# Patient Record
Sex: Female | Born: 1960 | Race: Black or African American | Hispanic: No | Marital: Married | Smoking: Former smoker
Health system: Southern US, Community
[De-identification: ages and names within clinical notes are randomized; demographics above are authoritative.]

## PROBLEM LIST (undated history)

## (undated) DIAGNOSIS — M25569 Pain in unspecified knee: Secondary | ICD-10-CM

## (undated) DIAGNOSIS — I10 Essential (primary) hypertension: Secondary | ICD-10-CM

## (undated) DIAGNOSIS — Z973 Presence of spectacles and contact lenses: Secondary | ICD-10-CM

## (undated) DIAGNOSIS — R42 Dizziness and giddiness: Secondary | ICD-10-CM

## (undated) DIAGNOSIS — M545 Low back pain, unspecified: Secondary | ICD-10-CM

## (undated) DIAGNOSIS — K219 Gastro-esophageal reflux disease without esophagitis: Secondary | ICD-10-CM

## (undated) DIAGNOSIS — K829 Disease of gallbladder, unspecified: Secondary | ICD-10-CM

## (undated) DIAGNOSIS — M25559 Pain in unspecified hip: Secondary | ICD-10-CM

## (undated) DIAGNOSIS — R7303 Prediabetes: Secondary | ICD-10-CM

## (undated) DIAGNOSIS — R0602 Shortness of breath: Secondary | ICD-10-CM

## (undated) DIAGNOSIS — E669 Obesity, unspecified: Secondary | ICD-10-CM

## (undated) DIAGNOSIS — M1990 Unspecified osteoarthritis, unspecified site: Secondary | ICD-10-CM

## (undated) DIAGNOSIS — T7840XA Allergy, unspecified, initial encounter: Secondary | ICD-10-CM

## (undated) DIAGNOSIS — E785 Hyperlipidemia, unspecified: Secondary | ICD-10-CM

## (undated) DIAGNOSIS — M7989 Other specified soft tissue disorders: Secondary | ICD-10-CM

## (undated) HISTORY — DX: Prediabetes: R73.03

## (undated) HISTORY — PX: CHOLECYSTECTOMY: SHX55

## (undated) HISTORY — DX: Essential (primary) hypertension: I10

## (undated) HISTORY — DX: Pain in unspecified hip: M25.559

## (undated) HISTORY — DX: Other specified soft tissue disorders: M79.89

## (undated) HISTORY — PX: TUBAL LIGATION: SHX77

## (undated) HISTORY — DX: Obesity, unspecified: E66.9

## (undated) HISTORY — DX: Disease of gallbladder, unspecified: K82.9

## (undated) HISTORY — DX: Allergy, unspecified, initial encounter: T78.40XA

## (undated) HISTORY — DX: Pain in unspecified knee: M25.569

## (undated) HISTORY — DX: Unspecified osteoarthritis, unspecified site: M19.90

## (undated) HISTORY — DX: Shortness of breath: R06.02

## (undated) HISTORY — DX: Low back pain, unspecified: M54.50

---

## 2014-02-16 ENCOUNTER — Ambulatory Visit: Payer: Self-pay | Admitting: Family Medicine

## 2014-02-16 LAB — HM PAP SMEAR: HM PAP: NORMAL

## 2014-02-16 LAB — HM MAMMOGRAPHY: HM MAMMO: ABNORMAL

## 2014-03-01 ENCOUNTER — Ambulatory Visit: Payer: Self-pay | Admitting: Family Medicine

## 2015-04-28 ENCOUNTER — Ambulatory Visit (INDEPENDENT_AMBULATORY_CARE_PROVIDER_SITE_OTHER): Payer: Commercial Managed Care - HMO | Admitting: Family Medicine

## 2015-04-28 ENCOUNTER — Encounter: Payer: Self-pay | Admitting: Family Medicine

## 2015-04-28 ENCOUNTER — Telehealth: Payer: Self-pay | Admitting: Family Medicine

## 2015-04-28 VITALS — BP 142/100 | HR 80 | Temp 98.5°F | Resp 16 | Ht 65.0 in | Wt 280.0 lb

## 2015-04-28 DIAGNOSIS — Z113 Encounter for screening for infections with a predominantly sexual mode of transmission: Secondary | ICD-10-CM | POA: Diagnosis not present

## 2015-04-28 DIAGNOSIS — Z0000 Encounter for general adult medical examination without abnormal findings: Secondary | ICD-10-CM | POA: Diagnosis not present

## 2015-04-28 DIAGNOSIS — B372 Candidiasis of skin and nail: Secondary | ICD-10-CM | POA: Diagnosis not present

## 2015-04-28 DIAGNOSIS — I10 Essential (primary) hypertension: Secondary | ICD-10-CM

## 2015-04-28 DIAGNOSIS — Z136 Encounter for screening for cardiovascular disorders: Secondary | ICD-10-CM | POA: Diagnosis not present

## 2015-04-28 DIAGNOSIS — Z803 Family history of malignant neoplasm of breast: Secondary | ICD-10-CM | POA: Insufficient documentation

## 2015-04-28 DIAGNOSIS — Z1231 Encounter for screening mammogram for malignant neoplasm of breast: Secondary | ICD-10-CM | POA: Diagnosis not present

## 2015-04-28 DIAGNOSIS — Z13220 Encounter for screening for lipoid disorders: Secondary | ICD-10-CM | POA: Diagnosis not present

## 2015-04-28 DIAGNOSIS — M25561 Pain in right knee: Secondary | ICD-10-CM

## 2015-04-28 DIAGNOSIS — M722 Plantar fascial fibromatosis: Secondary | ICD-10-CM | POA: Insufficient documentation

## 2015-04-28 DIAGNOSIS — Z23 Encounter for immunization: Secondary | ICD-10-CM | POA: Diagnosis not present

## 2015-04-28 DIAGNOSIS — Z1211 Encounter for screening for malignant neoplasm of colon: Secondary | ICD-10-CM | POA: Diagnosis not present

## 2015-04-28 DIAGNOSIS — R030 Elevated blood-pressure reading, without diagnosis of hypertension: Secondary | ICD-10-CM | POA: Insufficient documentation

## 2015-04-28 DIAGNOSIS — IMO0001 Reserved for inherently not codable concepts without codable children: Secondary | ICD-10-CM

## 2015-04-28 DIAGNOSIS — Z800 Family history of malignant neoplasm of digestive organs: Secondary | ICD-10-CM | POA: Insufficient documentation

## 2015-04-28 MED ORDER — NYSTATIN 100000 UNIT/GM EX CREA: 1.0000 "application " | TOPICAL_CREAM | Freq: Two times a day (BID) | CUTANEOUS | Status: DC

## 2015-04-28 MED ORDER — MELOXICAM 15 MG PO TABS: 15.0000 mg | ORAL_TABLET | Freq: Every day | ORAL | Status: DC | PRN

## 2015-04-28 MED ORDER — HYDROCHLOROTHIAZIDE 25 MG PO TABS: 25.0000 mg | ORAL_TABLET | Freq: Every day | ORAL | Status: DC

## 2015-04-28 NOTE — Telephone Encounter (Signed)
HCTZ sent to pharmacy 

## 2015-04-28 NOTE — Progress Notes (Signed)
Name: Knowledge Leaders   MRN: [id number]    DOB: 07/12/1960   Date:04/28/2015       Progress Note  Subjective  Chief Complaint  Chief Complaint  Patient presents with  . Annual Exam    HPI  Patient is here today for a Complete Female Physical Exam:  The patient has complaints of obesity, skin itching under breasts and neck, right knee pain. Right knee pain onset years ago and is on and off. Associated with swelling and laxity. No falls. Did not get xray done that was ordered previously which she had similar complaints. Skin itching under breasts onset a few months ago. No rash but can get red when she scratches the area. No new garments or laundry soaps. Previously history of elevated blood pressure however patient feels that this is only when she comes to the doctor.   Overall feels unhealthy. Max weight 290 lbs.  Diet is not well balanced. In general does not exercise regularly. Adipex helped a little in the past but not much. Sees dentist regularly and addresses vision concerns with ophthalmologist if applicable. In regards to sexual activity the patient is currently sexually active. Currently is not concerned about exposure to any STDs.     Past Medical History  Diagnosis Date  . Obesity   . Hypertension     Past Surgical History  Procedure Laterality Date  . Cholecystectomy      Family History  Problem Relation Age of Onset  . Kidney disease Mother   . Hypertension Mother   . Hypertension Father   . Cancer Sister     breast  . Cancer Brother     colon  . Kidney disease Brother     Social History   Social History  . Marital Status: Married    Spouse Name: N/A  . Number of Children: N/A  . Years of Education: N/A   Occupational History  . Not on file.   Social History Main Topics  . Smoking status: Not on file  . Smokeless tobacco: Not on file  . Alcohol Use: Not on file  . Drug Use: Not on file  . Sexual Activity: Not on file   Other Topics  Concern  . Not on file   Social History Narrative  . No narrative on file    No current outpatient prescriptions on file.  Allergies not on file  ROS  CONSTITUTIONAL: No significant weight changes, fever, chills, weakness or fatigue.  HEENT:  - Eyes: No visual changes.  - Ears: No auditory changes. No pain.  - Nose: No sneezing, congestion, runny nose. - Throat: No sore throat. No changes in swallowing. SKIN: Yes rash or itching.  CARDIOVASCULAR: No chest pain, chest pressure or chest discomfort. No palpitations or edema.  RESPIRATORY: No shortness of breath, cough or sputum.  GASTROINTESTINAL: No anorexia, nausea, vomiting. No changes in bowel habits. No abdominal pain or blood.  GENITOURINARY: No dysuria. No frequency. No discharge.  NEUROLOGICAL: No headache, dizziness, syncope, paralysis, ataxia, numbness or tingling in the extremities. No memory changes. No change in bowel or bladder control.  MUSCULOSKELETAL: Yes joint pain. No muscle pain. HEMATOLOGIC: No anemia, bleeding or bruising.  LYMPHATICS: No enlarged lymph nodes.  PSYCHIATRIC: No change in mood. No change in sleep pattern.  ENDOCRINOLOGIC: No reports of sweating, cold or heat intolerance. No polyuria or polydipsia.   Objective  Filed Vitals:   04/28/15 1413  BP: 142/102  Pulse: 80  Temp: 98.5 F (36.9  C)  TempSrc: Oral  Resp: 16  Height: 5\' 5"  (1.651 m)  Weight: 280 lb (127.007 kg)  SpO2: 96%   Body mass index is 46.59 kg/(m^2).  Depression screen PHQ 2/9 04/28/2015  Decreased Interest 0  Down, Depressed, Hopeless 0  PHQ - 2 Score 0     Physical Exam  Constitutional: Patient is obese and well-nourished. In no distress.  HEENT:  - Head: Normocephalic and atraumatic.  - Ears: Bilateral TMs gray, no erythema or effusion - Nose: Nasal mucosa moist - Mouth/Throat: Oropharynx is clear and moist. No tonsillar hypertrophy or erythema. No post nasal drainage.  - Eyes: Conjunctivae clear, EOM  movements normal. PERRLA. No scleral icterus.  Neck: Normal range of motion. Neck supple. No JVD present. No thyromegaly present.  Cardiovascular: Normal rate, regular rhythm and normal heart sounds.  No murmur heard.  Pulmonary/Chest: Effort normal and breath sounds normal. No respiratory distress. Abdominal: Soft. Bowel sounds are normal, no distension. There is no tenderness. no masses BREAST: Bilateral breast exam normal with no masses, skin changes or nipple discharge FEMALE GENITALIA: Patient declined.  Musculoskeletal: Normal range of motion bilateral UE and LE, no joint effusions. Right knee mild effusion without warmth or crepitus. Normal ROM. No laxity.  Peripheral vascular: Bilateral LE no edema. Neurological: CN II-XII grossly intact with no focal deficits. Alert and oriented to person, place, and time. Coordination, balance, strength, speech and gait are normal.  Skin: Skin is warm and dry. Discoloration under breast w/o skin break down.  Psychiatric: Patient has a normal mood and affect. Behavior is normal in office today. Judgment and thought content normal in office today.   Assessment & Plan  1. Annual physical exam Discussed in detail all recommended preventative measures appropriate for age and gender now and in the future.  2. Encounter for cholesteral screening for cardiovascular disease  - Lipid panel  3. Encounter for screening mammogram for malignant neoplasm of breast  - MM Digital Screening; Future  4. Encounter for screening for malignant neoplasm of colon  - Ambulatory referral to General Surgery  5. Screening for STD (sexually transmitted disease)  - HIV antibody - Hepatitis C antibody  6. Skin yeast infection Wear breathable cotton clothing and under garments.   - nystatin cream (MYCOSTATIN); Apply 1 application topically 2 (two) times daily.  Dispense: 30 g; Refill: 1  7. Right knee pain Chronic long standing problem likely due to arthritis and  obese weight.  - DG Knee Complete 4 Views Right; Future - meloxicam (MOBIC) 15 MG tablet; Take 1 tablet (15 mg total) by mouth daily as needed for pain.  Dispense: 30 tablet; Refill: 2  8. Obesity, Class III, BMI 40-49.9 (morbid obesity) ([place]) The patient has been counseled on their higher than normal BMI.  They have verbally expressed understanding their increased risk for other diseases.  In efforts to meet a better target BMI goal the patient has been counseled on lifestyle, diet and exercise modification tactics. Start with moderate intensity aerobic exercise (walking, jogging, elliptical, swimming, group or individual sports, hiking) at least 51mins a day at least 4 days a week and increase intensity, duration, frequency as tolerated. Diet should include well balance fresh fruits and vegetables avoiding processed foods, carbohydrates and sugars. Drink at least 8oz 10 glasses a day avoiding sodas, sugary fruit drinks, sweetened tea. Check weight on a reliable scale daily and monitor weight loss progress daily. Consider investing in mobile phone apps that will help keep track of weight  loss goals.  - TSH  9. Need for diphtheria-tetanus-pertussis (Tdap) vaccine, adult/adolescent  - Tdap vaccine greater than or equal to 7yo IM  10. Hypertension goal BP (blood pressure) < 140/90 NEW DIAGNOSIS. JNC-8 vs ADA treatment goals discussed with patient. Current blood pressure not well controled. Will start her on HCTZ, she seemed reluctant to accept diagnosis and get routine blood work.   - CBC with Differential/Platelet - Comprehensive metabolic panel - Hemoglobin A1c - Lipid panel - TSH - hydrochlorothiazide (HYDRODIURIL) 25 MG tablet; Take 1 tablet (25 mg total) by mouth daily.  Dispense: 90 tablet; Refill: 1

## 2015-04-28 NOTE — Telephone Encounter (Signed)
Was seen earlier today and in the appointment it was mentioned that you would prescribe fluid pills. Patient does not see the prescription on her clinical summary nor at the pharmacy. Please send to [facility]

## 2015-05-05 LAB — CBC WITH DIFFERENTIAL/PLATELET
BASOS ABS: 0 10*3/uL (ref 0.0–0.2)
Basos: 1 %
EOS (ABSOLUTE): 0.3 10*3/uL (ref 0.0–0.4)
Eos: 8 %
Hematocrit: 37.8 % (ref 34.0–46.6)
Hemoglobin: 14 g/dL (ref 11.1–15.9)
IMMATURE GRANS (ABS): 0 10*3/uL (ref 0.0–0.1)
IMMATURE GRANULOCYTES: 0 %
LYMPHS: 53 %
Lymphocytes Absolute: 1.9 10*3/uL (ref 0.7–3.1)
MCH: 32 pg (ref 26.6–33.0)
MCHC: 37 g/dL — ABNORMAL HIGH (ref 31.5–35.7)
MCV: 87 fL (ref 79–97)
MONOCYTES: 9 %
Monocytes Absolute: 0.3 10*3/uL (ref 0.1–0.9)
NEUTROS PCT: 29 %
Neutrophils Absolute: 1.1 10*3/uL — ABNORMAL LOW (ref 1.4–7.0)
PLATELETS: 211 10*3/uL (ref 150–379)
RBC: 4.37 x10E6/uL (ref 3.77–5.28)
RDW: 12.2 % — ABNORMAL LOW (ref 12.3–15.4)
WBC: 3.7 10*3/uL (ref 3.4–10.8)

## 2015-05-05 LAB — COMPREHENSIVE METABOLIC PANEL
ALT: 20 IU/L (ref 0–32)
AST: 23 IU/L (ref 0–40)
Albumin/Globulin Ratio: 1.5 (ref 1.1–2.5)
Albumin: 4.1 g/dL (ref 3.5–5.5)
Alkaline Phosphatase: 94 IU/L (ref 39–117)
BUN/Creatinine Ratio: 16 (ref 9–23)
BUN: 11 mg/dL (ref 6–24)
Bilirubin Total: 0.4 mg/dL (ref 0.0–1.2)
CALCIUM: 9.8 mg/dL (ref 8.7–10.2)
CHLORIDE: 100 mmol/L (ref 96–106)
CO2: 23 mmol/L (ref 18–29)
Creatinine, Ser: 0.7 mg/dL (ref 0.57–1.00)
GFR, EST AFRICAN AMERICAN: 114 mL/min/{1.73_m2} (ref >59)
GFR, EST NON AFRICAN AMERICAN: 99 mL/min/{1.73_m2} (ref >59)
GLUCOSE: 102 mg/dL — AB (ref 65–99)
Globulin, Total: 2.8 g/dL (ref 1.5–4.5)
Potassium: 4.1 mmol/L (ref 3.5–5.2)
Sodium: 140 mmol/L (ref 134–144)
TOTAL PROTEIN: 6.9 g/dL (ref 6.0–8.5)

## 2015-05-05 LAB — LIPID PANEL
CHOLESTEROL TOTAL: 161 mg/dL (ref 100–199)
Chol/HDL Ratio: 3 ratio units (ref 0.0–4.4)
HDL: 53 mg/dL (ref >39)
LDL CALC: 95 mg/dL (ref 0–99)
TRIGLYCERIDES: 65 mg/dL (ref 0–149)
VLDL CHOLESTEROL CAL: 13 mg/dL (ref 5–40)

## 2015-05-05 LAB — HIV ANTIBODY (ROUTINE TESTING W REFLEX): HIV Screen 4th Generation wRfx: NONREACTIVE

## 2015-05-05 LAB — HEMOGLOBIN A1C
ESTIMATED AVERAGE GLUCOSE: 117 mg/dL
Hgb A1c MFr Bld: 5.7 % — ABNORMAL HIGH (ref 4.8–5.6)

## 2015-05-05 LAB — HEPATITIS C ANTIBODY: Hep C Virus Ab: <0.1 s/co ratio (ref 0.0–0.9)

## 2015-05-05 LAB — TSH: TSH: 1.4 u[IU]/mL (ref 0.450–4.500)

## 2015-05-08 ENCOUNTER — Other Ambulatory Visit: Payer: Self-pay | Admitting: Family Medicine

## 2015-05-08 DIAGNOSIS — D709 Neutropenia, unspecified: Secondary | ICD-10-CM

## 2015-05-10 ENCOUNTER — Telehealth: Payer: Self-pay | Admitting: Family Medicine

## 2015-05-10 NOTE — Telephone Encounter (Signed)
Pt would like a call back. Cell is [phone]

## 2015-05-10 NOTE — Telephone Encounter (Signed)
Discussed labs with patient

## 2015-05-11 ENCOUNTER — Telehealth: Payer: Self-pay

## 2015-05-11 NOTE — Telephone Encounter (Signed)
Gastroenterology Pre-Procedure Review  Request Date: TBD Requesting Physician: Dr. Nadine Counts  PATIENT REVIEW QUESTIONS: The patient responded to the following health history questions as indicated:    1. Are you having any GI issues? no 2. Do you have a personal history of Polyps? no 3. Do you have a family history of Colon Cancer or Polyps? yes (Brother, colon cancer) 4. Diabetes Mellitus? no 5. Joint replacements in the past 12 months?no 6. Major health problems in the past 3 months?no 7. Any artificial heart valves, MVP, or defibrillator?no    MEDICATIONS & ALLERGIES:    Patient reports the following regarding taking any anticoagulation/antiplatelet therapy:   Plavix, Coumadin, Eliquis, Xarelto, Lovenox, Pradaxa, Brilinta, or Effient? no Aspirin? no  Patient confirms/reports the following medications:  Current Outpatient Prescriptions  Medication Sig Dispense Refill  . hydrochlorothiazide (HYDRODIURIL) 25 MG tablet Take 1 tablet (25 mg total) by mouth daily. 90 tablet 1  . meloxicam (MOBIC) 15 MG tablet Take 1 tablet (15 mg total) by mouth daily as needed for pain. 30 tablet 2  . nystatin cream (MYCOSTATIN) Apply 1 application topically 2 (two) times daily. 30 g 1   No current facility-administered medications for this visit.    Patient confirms/reports the following allergies:  No Known Allergies  No orders of the defined types were placed in this encounter.    AUTHORIZATION INFORMATION Primary Insurance: 1D#: Group #:  Secondary Insurance: 1D#: Group #:  SCHEDULE INFORMATION: Date: TBD - Will call back. Needs to speak with husband. Time: Location:

## 2015-05-18 ENCOUNTER — Other Ambulatory Visit: Payer: Self-pay

## 2015-05-25 ENCOUNTER — Other Ambulatory Visit: Payer: Self-pay | Admitting: Family Medicine

## 2015-05-26 LAB — CBC WITH DIFFERENTIAL/PLATELET
BASOS ABS: 0 10*3/uL (ref 0.0–0.2)
Basos: 1 %
EOS (ABSOLUTE): 0.1 10*3/uL (ref 0.0–0.4)
EOS: 3 %
HEMATOCRIT: 43 % (ref 34.0–46.6)
Hemoglobin: 14.5 g/dL (ref 11.1–15.9)
Immature Grans (Abs): 0 10*3/uL (ref 0.0–0.1)
Immature Granulocytes: 0 %
LYMPHS ABS: 2.1 10*3/uL (ref 0.7–3.1)
Lymphs: 47 %
MCH: 30 pg (ref 26.6–33.0)
MCHC: 33.7 g/dL (ref 31.5–35.7)
MCV: 89 fL (ref 79–97)
MONOS ABS: 0.4 10*3/uL (ref 0.1–0.9)
Monocytes: 9 %
NEUTROS PCT: 40 %
Neutrophils Absolute: 1.8 10*3/uL (ref 1.4–7.0)
PLATELETS: 267 10*3/uL (ref 150–379)
RBC: 4.83 x10E6/uL (ref 3.77–5.28)
RDW: 12.8 % (ref 12.3–15.4)
WBC: 4.4 10*3/uL (ref 3.4–10.8)

## 2015-06-09 ENCOUNTER — Encounter: Payer: Self-pay | Admitting: *Deleted

## 2015-06-14 ENCOUNTER — Ambulatory Visit
Admission: RE | Admit: 2015-06-14 | Discharge: 2015-06-14 | Disposition: A | Discharge status: Home / Self Care | Payer: Commercial Managed Care - HMO | Source: Ambulatory Visit | Attending: Family Medicine | Admitting: Family Medicine

## 2015-06-14 DIAGNOSIS — Z1231 Encounter for screening mammogram for malignant neoplasm of breast: Secondary | ICD-10-CM | POA: Diagnosis not present

## 2015-06-15 NOTE — Discharge Instructions (Signed)

## 2015-06-16 ENCOUNTER — Ambulatory Visit: Payer: Commercial Managed Care - HMO | Admitting: Anesthesiology

## 2015-06-16 ENCOUNTER — Ambulatory Visit
Admission: RE | Admit: 2015-06-16 | Discharge: 2015-06-16 | Disposition: A | Discharge status: Home / Self Care | Payer: Commercial Managed Care - HMO | Source: Ambulatory Visit | Attending: Gastroenterology | Admitting: Gastroenterology

## 2015-06-16 ENCOUNTER — Encounter
Admission: RE | Disposition: A | Discharge status: Home / Self Care | Payer: Self-pay | Source: Ambulatory Visit | Attending: Gastroenterology

## 2015-06-16 DIAGNOSIS — K5730 Diverticulosis of large intestine without perforation or abscess without bleeding: Secondary | ICD-10-CM | POA: Insufficient documentation

## 2015-06-16 DIAGNOSIS — K640 First degree hemorrhoids: Secondary | ICD-10-CM | POA: Insufficient documentation

## 2015-06-16 DIAGNOSIS — Z800 Family history of malignant neoplasm of digestive organs: Secondary | ICD-10-CM | POA: Diagnosis not present

## 2015-06-16 DIAGNOSIS — E669 Obesity, unspecified: Secondary | ICD-10-CM | POA: Insufficient documentation

## 2015-06-16 DIAGNOSIS — Z6841 Body Mass Index (BMI) 40.0 and over, adult: Secondary | ICD-10-CM | POA: Diagnosis not present

## 2015-06-16 DIAGNOSIS — Z79899 Other long term (current) drug therapy: Secondary | ICD-10-CM | POA: Insufficient documentation

## 2015-06-16 DIAGNOSIS — K219 Gastro-esophageal reflux disease without esophagitis: Secondary | ICD-10-CM | POA: Insufficient documentation

## 2015-06-16 DIAGNOSIS — D127 Benign neoplasm of rectosigmoid junction: Secondary | ICD-10-CM | POA: Diagnosis not present

## 2015-06-16 DIAGNOSIS — Z803 Family history of malignant neoplasm of breast: Secondary | ICD-10-CM | POA: Insufficient documentation

## 2015-06-16 DIAGNOSIS — Z87891 Personal history of nicotine dependence: Secondary | ICD-10-CM | POA: Diagnosis not present

## 2015-06-16 DIAGNOSIS — Z8249 Family history of ischemic heart disease and other diseases of the circulatory system: Secondary | ICD-10-CM | POA: Diagnosis not present

## 2015-06-16 DIAGNOSIS — Z1211 Encounter for screening for malignant neoplasm of colon: Secondary | ICD-10-CM | POA: Insufficient documentation

## 2015-06-16 DIAGNOSIS — I10 Essential (primary) hypertension: Secondary | ICD-10-CM | POA: Diagnosis not present

## 2015-06-16 DIAGNOSIS — Z841 Family history of disorders of kidney and ureter: Secondary | ICD-10-CM | POA: Diagnosis not present

## 2015-06-16 HISTORY — DX: Presence of spectacles and contact lenses: Z97.3

## 2015-06-16 HISTORY — PX: COLONOSCOPY WITH PROPOFOL: SHX5780

## 2015-06-16 HISTORY — PX: POLYPECTOMY: SHX149

## 2015-06-16 HISTORY — DX: Gastro-esophageal reflux disease without esophagitis: K21.9

## 2015-06-16 HISTORY — DX: Dizziness and giddiness: R42

## 2015-06-16 SURGERY — COLONOSCOPY WITH PROPOFOL
Anesthesia: Monitor Anesthesia Care | Wound class: Contaminated

## 2015-06-16 MED ORDER — LACTATED RINGERS IV SOLN
INTRAVENOUS | Status: DC
  Administered 2015-06-16: 09:00:00 via INTRAVENOUS

## 2015-06-16 MED ORDER — PROPOFOL 10 MG/ML IV BOLUS
INTRAVENOUS | Status: DC | PRN
  Administered 2015-06-16: 30 mg via INTRAVENOUS
  Administered 2015-06-16: 20 mg via INTRAVENOUS
  Administered 2015-06-16 (×2): 30 mg via INTRAVENOUS
  Administered 2015-06-16: 50 mg via INTRAVENOUS
  Administered 2015-06-16: 20 mg via INTRAVENOUS
  Administered 2015-06-16: 30 mg via INTRAVENOUS

## 2015-06-16 MED ORDER — ONDANSETRON HCL 4 MG/2ML IJ SOLN: 4.0000 mg | Freq: Once | INTRAMUSCULAR | Status: DC | PRN

## 2015-06-16 MED ORDER — ACETAMINOPHEN 160 MG/5ML PO SOLN: 325.0000 mg | ORAL | Status: DC | PRN

## 2015-06-16 MED ORDER — ACETAMINOPHEN 325 MG PO TABS: 325.0000 mg | ORAL_TABLET | ORAL | Status: DC | PRN

## 2015-06-16 MED ORDER — LIDOCAINE HCL (CARDIAC) 20 MG/ML IV SOLN
INTRAVENOUS | Status: DC | PRN
  Administered 2015-06-16: 50 mg via INTRAVENOUS

## 2015-06-16 SURGICAL SUPPLY — 28 items

## 2015-06-16 NOTE — Anesthesia Procedure Notes (Signed)
Procedure Name: MAC Performed by: Mikayla Chiusano Pre-anesthesia Checklist: Patient identified, Emergency Drugs available, Suction available, Timeout performed and Patient being monitored Patient Re-evaluated:Patient Re-evaluated prior to inductionOxygen Delivery Method: Nasal cannula Placement Confirmation: positive ETCO2       

## 2015-06-16 NOTE — Anesthesia Postprocedure Evaluation (Signed)
Anesthesia Post Note  Patient: Amber Murray  Procedure(s) Performed: Procedure(s) (LRB): COLONOSCOPY WITH PROPOFOL (N/A) POLYPECTOMY INTESTINAL  Patient location during evaluation: PACU Anesthesia Type: MAC Level of consciousness: awake and alert Pain management: pain level controlled Vital Signs Assessment: post-procedure vital signs reviewed and stable Respiratory status: spontaneous breathing, nonlabored ventilation, respiratory function stable and patient connected to nasal cannula oxygen Cardiovascular status: stable and blood pressure returned to baseline Anesthetic complications: no    Amaryllis Dyke

## 2015-06-16 NOTE — Op Note (Signed)
[facility] Gastroenterology Patient Name: Amber Murray Procedure Date: 06/16/2015 10:10 AM MRN: [id number] Account #: [id number] Date of Birth: 01/17/1961 Admit Type: Outpatient Age: 55 Room: [facility] OR ROOM 01 Gender: Female Note Status: Finalized Procedure:            Colonoscopy Indications:          Screening for colorectal malignant neoplasm Providers:            Lucilla Lame, MD Referring MD:         Bobetta Lime (Referring MD) Medicines:            Propofol per Anesthesia Complications:        No immediate complications. Procedure:            Pre-Anesthesia Assessment:                       - Prior to the procedure, a History and Physical was                        performed, and patient medications and allergies were                        reviewed. The patient's tolerance of previous                        anesthesia was also reviewed. The risks and benefits of                        the procedure and the sedation options and risks were                        discussed with the patient. All questions were                        answered, and informed consent was obtained. Prior                        Anticoagulants: The patient has taken no previous                        anticoagulant or antiplatelet agents. ASA Grade                        Assessment: II - A patient with mild systemic disease.                        After reviewing the risks and benefits, the patient was                        deemed in satisfactory condition to undergo the                        procedure.                       After obtaining informed consent, the colonoscope was                        passed under direct vision. Throughout the procedure,  the patient's blood pressure, pulse, and oxygen                        saturations were monitored continuously. The was                        introduced through the anus and advanced to the the                         cecum, identified by appendiceal orifice and ileocecal                        valve. The colonoscopy was performed without                        difficulty. The patient tolerated the procedure well.                        The quality of the bowel preparation was excellent. Findings:      The perianal and digital rectal examinations were normal.      Multiple small-mouthed diverticula were found in the sigmoid colon.      Four sessile polyps were found in the recto-sigmoid colon. The polyps       were 2 to 4 mm in size. These polyps were removed with a cold biopsy       forceps. Resection and retrieval were complete.      Non-bleeding internal hemorrhoids were found during retroflexion. The       hemorrhoids were Grade I (internal hemorrhoids that do not prolapse). Impression:           - Diverticulosis in the sigmoid colon.                       - Four 2 to 4 mm polyps at the recto-sigmoid colon,                        removed with a cold biopsy forceps. Resected and                        retrieved.                       - Non-bleeding internal hemorrhoids. Recommendation:       - Await pathology results.                       - Repeat colonoscopy in 5 years if polyp adenoma and 10                        years if hyperplastic Procedure Code(s):    --- Professional ---                       [phone], Colonoscopy, flexible; with biopsy, single or                        multiple Diagnosis Code(s):    --- Professional ---                       Z12.11, Encounter for screening for malignant neoplasm  of colon                       D12.7, Benign neoplasm of rectosigmoid junction CPT copyright 2016 American Medical Association. All rights reserved. The codes documented in this report are preliminary and upon coder review may  be revised to meet current compliance requirements. Lucilla Lame, MD 06/16/2015 10:29:38 AM This report has been signed  electronically. Number of Addenda: 0 Note Initiated On: 06/16/2015 10:10 AM Scope Withdrawal Time: 0 hours 7 minutes 53 seconds  Total Procedure Duration: 0 hours 11 minutes 18 seconds       [facility]

## 2015-06-16 NOTE — Transfer of Care (Signed)
Immediate Anesthesia Transfer of Care Note  Patient: Amber Murray  Procedure(s) Performed: Procedure(s) with comments: COLONOSCOPY WITH PROPOFOL (N/A) POLYPECTOMY INTESTINAL - Sigmoid colon polyp  Patient Location: PACU  Anesthesia Type: MAC  Level of Consciousness: awake, alert  and patient cooperative  Airway and Oxygen Therapy: Patient Spontanous Breathing and Patient connected to supplemental oxygen  Post-op Assessment: Post-op Vital signs reviewed, Patient's Cardiovascular Status Stable, Respiratory Function Stable, Patent Airway and No signs of Nausea or vomiting  Post-op Vital Signs: Reviewed and stable  Complications: No apparent anesthesia complications

## 2015-06-16 NOTE — Anesthesia Preprocedure Evaluation (Signed)
Anesthesia Evaluation  Patient identified by MRN, date of birth, ID band Patient awake    Reviewed: Allergy & Precautions, H&P , NPO status   Airway Mallampati: II  TM Distance: >3 FB Neck ROM: full    Dental   Pulmonary former smoker,    Pulmonary exam normal        Cardiovascular hypertension, Normal cardiovascular exam     Neuro/Psych    GI/Hepatic GERD  ,  Endo/Other    Renal/GU      Musculoskeletal   Abdominal   Peds  Hematology   Anesthesia Other Findings   Reproductive/Obstetrics                             Anesthesia Physical Anesthesia Plan  ASA: III  Anesthesia Plan: MAC   Post-op Pain Management:    Induction:   Airway Management Planned:   Additional Equipment:   Intra-op Plan:   Post-operative Plan:   Informed Consent: I have reviewed the patients History and Physical, chart, labs and discussed the procedure including the risks, benefits and alternatives for the proposed anesthesia with the patient or authorized representative who has indicated his/her understanding and acceptance.     Plan Discussed with: CRNA  Anesthesia Plan Comments:         Anesthesia Quick Evaluation

## 2015-06-16 NOTE — H&P (Signed)
  [facility]  [address], [place], [place] [address] Phone: [phone] Fax : [phone]  Primary Care Physician:  Bobetta Lime, MD Primary Gastroenterologist:  Dr. Allen Norris  Pre-Procedure History & Physical: HPI:  Amber Murray is a 55 y.o. female is here for a screening colonoscopy.   Past Medical History  Diagnosis Date  . Obesity   . Hypertension   . GERD (gastroesophageal reflux disease)     occasional   . Wears contact lenses     sometimes  . Vertigo     noo episodes in several yrs    Past Surgical History  Procedure Laterality Date  . Cholecystectomy      Prior to Admission medications   Medication Sig Start Date End Date Taking? Authorizing Provider  Cyanocobalamin (VITAMIN B-12 PO) Take by mouth daily.   Yes Historical Provider, MD  hydrochlorothiazide (HYDRODIURIL) 25 MG tablet Take 1 tablet (25 mg total) by mouth daily. 04/28/15  Yes Bobetta Lime, MD  meloxicam (MOBIC) 15 MG tablet Take 1 tablet (15 mg total) by mouth daily as needed for pain. 04/28/15  Yes Bobetta Lime, MD  nystatin cream (MYCOSTATIN) Apply 1 application topically 2 (two) times daily. 04/28/15  Yes Bobetta Lime, MD    Allergies as of 05/18/2015  . (No Known Allergies)    Family History  Problem Relation Age of Onset  . Kidney disease Mother   . Hypertension Mother   . Hypertension Father   . Cancer Sister     breast  . Breast cancer Sister 73  . Cancer Brother     colon  . Kidney disease Brother     Social History   Social History  . Marital Status: Married    Spouse Name: N/A  . Number of Children: N/A  . Years of Education: N/A   Occupational History  . Not on file.   Social History Main Topics  . Smoking status: Former Smoker    Quit date: 04/27/2005  . Smokeless tobacco: Never Used  . Alcohol Use: 1.2 oz/week    0 Standard drinks or equivalent, 2 Shots of liquor per week  . Drug Use: Yes    Special: Marijuana     Comment:  1x/mo - last time 06/09/15  . Sexual Activity: Yes   Other Topics Concern  . Not on file   Social History Narrative    Review of Systems: See HPI, otherwise negative ROS  Physical Exam: BP 129/83 mmHg  Pulse 73  Temp(Src) 97.3 F (36.3 C) (Temporal)  Resp 16  Ht 5\' 5"  (1.651 m)  Wt 278 lb (126.1 kg)  BMI 46.26 kg/m2  SpO2 97% General:   Alert,  pleasant and cooperative in NAD Head:  Normocephalic and atraumatic. Neck:  Supple; no masses or thyromegaly. Lungs:  Clear throughout to auscultation.    Heart:  Regular rate and rhythm. Abdomen:  Soft, nontender and nondistended. Normal bowel sounds, without guarding, and without rebound.   Neurologic:  Alert and  oriented x4;  grossly normal neurologically.  Impression/Plan: Amber Murray is now here to undergo a screening colonoscopy.  Risks, benefits, and alternatives regarding colonoscopy have been reviewed with the patient.  Questions have been answered.  All parties agreeable.

## 2015-06-19 ENCOUNTER — Encounter: Payer: Self-pay | Admitting: Gastroenterology

## 2015-06-20 ENCOUNTER — Encounter: Payer: Self-pay | Admitting: Gastroenterology

## 2015-11-13 ENCOUNTER — Ambulatory Visit: Payer: Commercial Managed Care - HMO | Admitting: Family Medicine

## 2015-11-13 ENCOUNTER — Encounter: Payer: Self-pay | Admitting: Family Medicine

## 2015-11-13 ENCOUNTER — Ambulatory Visit (INDEPENDENT_AMBULATORY_CARE_PROVIDER_SITE_OTHER): Payer: Commercial Managed Care - HMO | Admitting: Family Medicine

## 2015-11-13 ENCOUNTER — Other Ambulatory Visit: Payer: Self-pay

## 2015-11-13 VITALS — BP 132/94 | HR 79 | Temp 97.8°F | Resp 14 | Wt 286.0 lb

## 2015-11-13 DIAGNOSIS — R0602 Shortness of breath: Secondary | ICD-10-CM

## 2015-11-13 DIAGNOSIS — I10 Essential (primary) hypertension: Secondary | ICD-10-CM

## 2015-11-13 DIAGNOSIS — M25561 Pain in right knee: Secondary | ICD-10-CM

## 2015-11-13 DIAGNOSIS — R7303 Prediabetes: Secondary | ICD-10-CM | POA: Diagnosis not present

## 2015-11-13 DIAGNOSIS — Z5181 Encounter for therapeutic drug level monitoring: Secondary | ICD-10-CM

## 2015-11-13 HISTORY — DX: Prediabetes: R73.03

## 2015-11-13 MED ORDER — LIRAGLUTIDE -WEIGHT MANAGEMENT 18 MG/3ML ~~LOC~~ SOPN: 0.6000 mg | PEN_INJECTOR | Freq: Every day | SUBCUTANEOUS | 0 refills | Status: DC

## 2015-11-13 NOTE — Assessment & Plan Note (Signed)
Check lytes and Cr

## 2015-11-13 NOTE — Progress Notes (Signed)
BP (!) 132/94   Pulse 79   Temp 97.8 F (36.6 C) (Oral)   Resp 14   Wt 286 lb (129.7 kg)   SpO2 97%   BMI 47.59 kg/m    Subjective:    Patient ID: Amber Murray, female    DOB: 17-Mar-1961, 55 y.o.   MRN: [id number]  HPI: [name] is a 55 y.o. female  Chief Complaint  Patient presents with  . 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 drink [facility] She has read about Saxenda and would like to try that; no family hx of MEN-2; no personal hx of MTC  She was having pain in the right medial knee; not taking meloxicam; having pain in the left arm, wonders if med would have that too; 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  No one in the fmaily with diabetes; last labs reviewed; she was not aware that her labs was in the prediabetes range; A1c 5.9 and glucose 102 Lab Results  Component Value Date   HGBA1C 5.9 (H) 11/13/2015   Sometimes gets shortness of breath; no with exercise, just loses breath and has to take a deep breath; probably talking too much; able to lay flat; only swelling in the legs without medicine; no chest pain  Energy level good  Depression screen [facility] 2/9 11/13/2015 04/28/2015  Decreased Interest 0 0  Down, Depressed, Hopeless 0 0  PHQ - 2 Score 0 0   Relevant past medical, surgical, family and social history reviewed Past Medical History:  Diagnosis Date  . GERD (gastroesophageal reflux disease)    occasional   . Hypertension   . Obesity   . Prediabetes 11/13/2015  . Vertigo    noo episodes in several yrs  . Wears contact  lenses    sometimes   Past Surgical History:  Procedure Laterality Date  . CHOLECYSTECTOMY    . COLONOSCOPY WITH PROPOFOL N/A 06/16/2015   Procedure: COLONOSCOPY WITH PROPOFOL;  Surgeon: Lucilla Lame, MD;  Location: [place];  Service: Endoscopy;  Laterality: N/A;  . POLYPECTOMY  06/16/2015   Procedure: POLYPECTOMY INTESTINAL;  Surgeon: Lucilla Lame, MD;  Location: [place];  Service: Endoscopy;;  Sigmoid colon polyp   Family History  Problem Relation Age of Onset  . Kidney disease Mother   . Hypertension Mother   . Hypertension Father   . Cancer Sister     breast  . Breast cancer Sister 56  . Cancer Brother     colon  . Kidney disease Brother    Social History  Substance Use Topics  . Smoking status: Former Smoker    Quit date: 04/27/2005  . Smokeless tobacco: Never Used  . Alcohol use 1.2 oz/week    2 Shots of liquor per week   Interim medical history since last visit reviewed. Allergies and medications reviewed  Review of Systems Per HPI unless specifically indicated above     Objective:    BP (!) 132/94   Pulse 79   Temp 97.8 F (36.6 C) (Oral)  Resp 14   Wt 286 lb (129.7 kg)   SpO2 97%   BMI 47.59 kg/m   Wt Readings from Last 3 Encounters:  11/13/15 286 lb (129.7 kg)  06/16/15 278 lb (126.1 kg)  04/28/15 280 lb (127 kg)    Physical Exam  Constitutional: She appears well-developed and well-nourished. No distress.  Morbidly obese  HENT:  Head: Normocephalic and atraumatic.  Eyes: EOM are normal. No scleral icterus.  Neck: No thyromegaly present.  Cardiovascular: Normal rate, regular rhythm and normal heart sounds.   No murmur heard. Pulmonary/Chest: Effort normal and breath sounds normal. No respiratory distress. She has no wheezes. She has no rales.  Abdominal: Soft. Bowel sounds are normal. She exhibits no distension.  Musculoskeletal: Normal range of motion. She exhibits no edema.  Neurological: She is alert.  Skin: Skin is  warm and dry. She is not diaphoretic. No pallor.  Psychiatric: She has a normal mood and affect. Her behavior is normal. Judgment and thought content normal.       Assessment & Plan:   Problem List Items Addressed This Visit      Cardiovascular and Mediastinum   Essential hypertension, benign - Primary    Work on weight loss; try DASH guidelines; as she loses weight, I would love to be able to reduce/stop BP meds; she will work on weight loss and healthier eating        Other   Shortness of breath    Very intermittent, not associated with activity; no pitting pretibial edema, no crackles, no S3 or S4; I suspect this may be some element of restrictive lung disease with her morbid obesity; we'll have her work on weight loss and see if improves; if not, testing and work-up      Prediabetes    Check glucose and A1c; reviewed her previous labs with her, explained diagnosis; risk of diabetes down the road; single most important thing she can do is work on significant weight loss      Relevant Orders   Hemoglobin A1c (Completed)   Obesity, Class III, BMI 40-49.9 (morbid obesity) ([place])    Addressed, see AVS; explained that I do NOT want to prescribe phentermine for her; I would agree with Saxenda; close f/u; limit portions, reviewed [email or web address] website with her, rcommendations for not skipping meals, getting 64 ounces of water daily, avoid empty calories and sugary drinks, etc.      Relevant Medications   Liraglutide -Weight Management (SAXENDA) 18 MG/3ML SOPN   Medication monitoring encounter    Check lytes and Cr      Relevant Orders   Comprehensive metabolic panel (Completed)    Other Visit Diagnoses   None.     Follow up plan: Return 4-6 weeks, for weight management.  An after-visit summary was printed and given to the patient at [place].  Please see the patient instructions which may contain other information and recommendations beyond what is mentioned above in the  assessment and plan.  Meds ordered this encounter  Medications  . Liraglutide -Weight Management (SAXENDA) 18 MG/3ML SOPN    Sig: Inject 0.6 mg into the skin daily. x 1 week, then 1.2 mg daily x 1 week, then 1.8 mg daily x 1 week, then 2.4 mg daily x 1 week, then 3 mg daily    Dispense:  15 mL    Refill:  0    Dispense pen needles 31 gauge, QS + 1 refill    Orders Placed This Encounter  Procedures  .  Comprehensive metabolic panel  . Hemoglobin A1c

## 2015-11-13 NOTE — Patient Instructions (Addendum)
Check out the information at [email or web address].org entitled "Nutrition for Weight Loss: What You Need to Know about Fad Diets" Try to lose between 1-2 pounds per week by taking in fewer calories and burning off more calories You can succeed by limiting portions, limiting foods dense in calories and fat, becoming more active, and drinking 8 glasses of water a day (64 ounces) Don't skip meals, especially breakfast, as skipping meals may alter your metabolism Do not use over-the-counter weight loss pills or gimmicks that claim rapid weight loss A healthy BMI (or body mass index) is between 18.5 and 24.9 You can calculate your ideal BMI at the [place] website ClubMonetize.fr  Your goal blood pressure is less than 140 mmHg on top and less than 90 on the bottom Try to follow the DASH guidelines (DASH stands for Dietary Approaches to Stop Hypertension) Try to limit the sodium in your diet.  Ideally, consume less than 1.5 grams (less than 1,500mg ) per day. Do not add salt when cooking or at the table.  Check the sodium amount on labels when shopping, and choose items lower in sodium when given a choice. Avoid or limit foods that already contain a lot of sodium. Eat a diet rich in fruits and vegetables and whole grains.  DASH Eating Plan DASH stands for "Dietary Approaches to Stop Hypertension." The DASH eating plan is a healthy eating plan that has been shown to reduce high blood pressure (hypertension). Additional health benefits may include reducing the risk of type 2 diabetes mellitus, heart disease, and stroke. The DASH eating plan may also help with weight loss. WHAT DO I NEED TO KNOW ABOUT THE DASH EATING PLAN? For the DASH eating plan, you will follow these general guidelines:  Choose foods with a percent daily value for sodium of less than 5% (as listed on the food label).  Use salt-free seasonings or herbs instead of table salt or sea salt.  Check  with your health care provider or pharmacist before using salt substitutes.  Eat lower-sodium products, often labeled as "lower sodium" or "no salt added."  Eat fresh foods.  Eat more vegetables, fruits, and low-fat dairy products.  Choose whole grains. Look for the word "whole" as the first word in the ingredient list.  Choose fish and skinless chicken or [place] more often than red meat. Limit fish, poultry, and meat to 6 oz (170 g) each day.  Limit sweets, desserts, sugars, and sugary drinks.  Choose heart-healthy fats.  Limit cheese to 1 oz (28 g) per day.  Eat more home-cooked food and less restaurant, buffet, and fast food.  Limit fried foods.  Cook foods using methods other than frying.  Limit canned vegetables. If you do use them, rinse them well to decrease the sodium.  When eating at a restaurant, ask that your food be prepared with less salt, or no salt if possible. WHAT FOODS CAN I EAT? Seek help from a dietitian for individual calorie needs. Grains Whole grain or whole wheat bread. Brown rice. Whole grain or whole wheat pasta. Quinoa, bulgur, and whole grain cereals. Low-sodium cereals. Corn or whole wheat flour tortillas. Whole grain cornbread. Whole grain crackers. Low-sodium crackers. Vegetables Fresh or frozen vegetables (raw, steamed, roasted, or grilled). Low-sodium or reduced-sodium tomato and vegetable juices. Low-sodium or reduced-sodium tomato sauce and paste. Low-sodium or reduced-sodium canned vegetables.  Fruits All fresh, canned (in natural juice), or frozen fruits. Meat and Other Protein Products Ground beef (85% or leaner), grass-fed beef, or beef trimmed of  fat. Skinless chicken or [place]. Ground chicken or [place]. Pork trimmed of fat. All fish and seafood. Eggs. Dried beans, peas, or lentils. Unsalted nuts and seeds. Unsalted canned beans. Dairy Low-fat dairy products, such as skim or 1% milk, 2% or reduced-fat cheeses, low-fat ricotta or cottage  cheese, or plain low-fat yogurt. Low-sodium or reduced-sodium cheeses. Fats and Oils Tub margarines without trans fats. Light or reduced-fat mayonnaise and salad dressings (reduced sodium). Avocado. Safflower, olive, or canola oils. Natural peanut or almond butter. Other Unsalted popcorn and pretzels. The items listed above may not be a complete list of recommended foods or beverages. Contact your dietitian for more options. WHAT FOODS ARE NOT RECOMMENDED? Grains White bread. White pasta. White rice. Refined cornbread. Bagels and croissants. Crackers that contain trans fat. Vegetables Creamed or fried vegetables. Vegetables in a cheese sauce. Regular canned vegetables. Regular canned tomato sauce and paste. Regular tomato and vegetable juices. Fruits Dried fruits. Canned fruit in light or heavy syrup. Fruit juice. Meat and Other Protein Products Fatty cuts of meat. Ribs, chicken wings, bacon, sausage, bologna, salami, chitterlings, fatback, hot dogs, bratwurst, and packaged luncheon meats. Salted nuts and seeds. Canned beans with salt. Dairy Whole or 2% milk, cream, half-and-half, and cream cheese. Whole-fat or sweetened yogurt. Full-fat cheeses or blue cheese. Nondairy creamers and whipped toppings. Processed cheese, cheese spreads, or cheese curds. Condiments Onion and garlic salt, seasoned salt, table salt, and sea salt. Canned and packaged gravies. Worcestershire sauce. Tartar sauce. Barbecue sauce. Teriyaki sauce. Soy sauce, including reduced sodium. Steak sauce. Fish sauce. Oyster sauce. Cocktail sauce. Horseradish. Ketchup and mustard. Meat flavorings and tenderizers. Bouillon cubes. Hot sauce. Tabasco sauce. Marinades. Taco seasonings. Relishes. Fats and Oils Butter, stick margarine, lard, shortening, ghee, and bacon fat. Coconut, palm kernel, or palm oils. Regular salad dressings. Other Pickles and olives. Salted popcorn and pretzels. The items listed above may not be a complete list  of foods and beverages to avoid. Contact your dietitian for more information. WHERE CAN I FIND MORE INFORMATION? National Heart, Lung, and Blood Institute: travelstabloid.com   This information is not intended to replace advice given to you by your health care provider. Make sure you discuss any questions you have with your health care provider.   Document Released: 03/14/2011 Document Revised: 04/15/2014 Document Reviewed: 01/27/2013 Elsevier Interactive Patient Education [employer].

## 2015-11-13 NOTE — Assessment & Plan Note (Addendum)
Check glucose and A1c; reviewed her previous labs with her, explained diagnosis; risk of diabetes down the road; single most important thing she can do is work on significant weight loss

## 2015-11-13 NOTE — Assessment & Plan Note (Addendum)
Addressed, see AVS; explained that I do NOT want to prescribe phentermine for her; I would agree with Saxenda; close f/u; limit portions, reviewed [email or web address] website with her, rcommendations for not skipping meals, getting 64 ounces of water daily, avoid empty calories and sugary drinks, etc.

## 2015-11-14 DIAGNOSIS — I10 Essential (primary) hypertension: Secondary | ICD-10-CM | POA: Insufficient documentation

## 2015-11-14 DIAGNOSIS — R0602 Shortness of breath: Secondary | ICD-10-CM | POA: Insufficient documentation

## 2015-11-14 LAB — COMPREHENSIVE METABOLIC PANEL
ALT: 20 U/L (ref 6–29)
AST: 20 U/L (ref 10–35)
Albumin: 4.1 g/dL (ref 3.6–5.1)
Alkaline Phosphatase: 87 U/L (ref 33–130)
BUN: 18 mg/dL (ref 7–25)
CHLORIDE: 104 mmol/L (ref 98–110)
CO2: 26 mmol/L (ref 20–31)
CREATININE: 0.63 mg/dL (ref 0.50–1.05)
Calcium: 9.6 mg/dL (ref 8.6–10.4)
GLUCOSE: 88 mg/dL (ref 65–99)
POTASSIUM: 3.9 mmol/L (ref 3.5–5.3)
SODIUM: 139 mmol/L (ref 135–146)
Total Bilirubin: 0.3 mg/dL (ref 0.2–1.2)
Total Protein: 6.8 g/dL (ref 6.1–8.1)

## 2015-11-14 LAB — HEMOGLOBIN A1C
HEMOGLOBIN A1C: 5.9 % — AB (ref <5.7)
Mean Plasma Glucose: 123 mg/dL

## 2015-11-14 MED ORDER — MELOXICAM 15 MG PO TABS: 15.0000 mg | ORAL_TABLET | Freq: Every day | ORAL | 2 refills | Status: DC | PRN

## 2015-11-14 MED ORDER — HYDROCHLOROTHIAZIDE 25 MG PO TABS: 25.0000 mg | ORAL_TABLET | Freq: Every day | ORAL | 5 refills | Status: DC

## 2015-11-14 NOTE — Assessment & Plan Note (Signed)
Cautioned about use of NSAIDs; can increase BP and cause long-term kidney damage

## 2015-11-14 NOTE — Assessment & Plan Note (Signed)
Very intermittent, not associated with activity; no pitting pretibial edema, no crackles, no S3 or S4; I suspect this may be some element of restrictive lung disease with her morbid obesity; we'll have her work on weight loss and see if improves; if not, testing and work-up

## 2015-11-14 NOTE — Telephone Encounter (Signed)
Labs from yesterday reviewed; Rxs approved

## 2015-11-14 NOTE — Assessment & Plan Note (Signed)
Work on weight loss; try DASH guidelines; as she loses weight, I would love to be able to reduce/stop BP meds; she will work on weight loss and healthier eating

## 2015-12-12 ENCOUNTER — Other Ambulatory Visit: Payer: Self-pay

## 2015-12-12 DIAGNOSIS — I10 Essential (primary) hypertension: Secondary | ICD-10-CM

## 2015-12-12 MED ORDER — HYDROCHLOROTHIAZIDE 25 MG PO TABS: 25.0000 mg | ORAL_TABLET | Freq: Every day | ORAL | 1 refills | Status: DC

## 2015-12-12 NOTE — Telephone Encounter (Signed)
Last cmp reviewed; rx approved; I added pharmacy to the list

## 2015-12-12 NOTE — Telephone Encounter (Signed)
Pt needs re-sent [facility]

## 2015-12-18 ENCOUNTER — Encounter: Payer: Self-pay | Admitting: Family Medicine

## 2015-12-18 ENCOUNTER — Ambulatory Visit (INDEPENDENT_AMBULATORY_CARE_PROVIDER_SITE_OTHER): Payer: Commercial Managed Care - HMO | Admitting: Family Medicine

## 2015-12-18 VITALS — BP 128/84 | HR 92 | Temp 98.3°F | Ht 65.0 in | Wt 280.6 lb

## 2015-12-18 DIAGNOSIS — Z5181 Encounter for therapeutic drug level monitoring: Secondary | ICD-10-CM | POA: Insufficient documentation

## 2015-12-18 DIAGNOSIS — I10 Essential (primary) hypertension: Secondary | ICD-10-CM | POA: Diagnosis not present

## 2015-12-18 DIAGNOSIS — R7303 Prediabetes: Secondary | ICD-10-CM

## 2015-12-18 LAB — LIPID PANEL
CHOLESTEROL: 181 mg/dL (ref 125–200)
HDL: 44 mg/dL — ABNORMAL LOW (ref >=46)
LDL Cholesterol: 106 mg/dL (ref <130)
TRIGLYCERIDES: 156 mg/dL — AB (ref <150)
Total CHOL/HDL Ratio: 4.1 Ratio (ref <=5.0)
VLDL: 31 mg/dL — ABNORMAL HIGH (ref <30)

## 2015-12-18 LAB — BASIC METABOLIC PANEL WITH GFR
BUN: 21 mg/dL (ref 7–25)
CHLORIDE: 101 mmol/L (ref 98–110)
CO2: 29 mmol/L (ref 20–31)
Calcium: 9.4 mg/dL (ref 8.6–10.4)
Creat: 0.86 mg/dL (ref 0.50–1.05)
GFR, Est African American: 88 mL/min (ref >=60)
GFR, Est Non African American: 76 mL/min (ref >=60)
GLUCOSE: 93 mg/dL (ref 65–99)
POTASSIUM: 4 mmol/L (ref 3.5–5.3)
Sodium: 139 mmol/L (ref 135–146)

## 2015-12-18 LAB — MAGNESIUM: Magnesium: 2 mg/dL (ref 1.5–2.5)

## 2015-12-18 NOTE — Assessment & Plan Note (Addendum)
Check A1c today; weight loss is key to slow or prevent progression to outright diabetes mellitus

## 2015-12-18 NOTE — Assessment & Plan Note (Addendum)
Check Na+, Mg2+, especially with her leg cramps; advised that she may be drinking too much water; safe limits discussed, risk of excessive H2O explained

## 2015-12-18 NOTE — Progress Notes (Signed)
BP 128/84   Pulse 92   Temp 98.3 F (36.8 C)   Ht 5\' 5"  (1.651 m)   Wt 280 lb 9 oz (127.3 kg)   SpO2 97%   BMI 46.69 kg/m    Subjective:    Patient ID: Amber Murray, female    DOB: [birth year], 55 y.o.   MRN: [id number]  HPI: Amber Murray is a 55 y.o. female  Chief Complaint  Patient presents with  . 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 GERD (gastroesophageal reflux disease)    occasional   . Hypertension   . Obesity   . Prediabetes 11/13/2015  . Vertigo    noo episodes in several yrs  . Wears contact lenses    sometimes   Past Surgical History:  Procedure Laterality Date  . CHOLECYSTECTOMY    . COLONOSCOPY WITH PROPOFOL N/A 06/16/2015   Procedure: COLONOSCOPY WITH PROPOFOL;  Surgeon: Lucilla Lame, MD;  Location: [place];  Service: Endoscopy;  Laterality: N/A;  . POLYPECTOMY  06/16/2015   Procedure: POLYPECTOMY INTESTINAL;  Surgeon: Lucilla Lame, MD;  Location: [place];  Service: Endoscopy;;  Sigmoid colon polyp   Social History  Substance Use Topics  . Smoking status: Former Smoker    Quit date: 04/27/2005  . Smokeless tobacco: Never Used  . Alcohol use 1.2 oz/week    2 Shots of liquor per week   Interim medical history since last visit reviewed. Allergies and medications reviewed  Review of Systems  Cramps in legs Per HPI unless specifically  indicated above     Objective:    BP 128/84   Pulse 92   Temp 98.3 F (36.8 C)   Ht 5\' 5"  (1.651 m)   Wt 280 lb 9 oz (127.3 kg)   SpO2 97%   BMI 46.69 kg/m   Wt Readings from Last 3 Encounters:  12/18/15 280 lb 9 oz (127.3 kg)  11/13/15 286 lb (129.7 kg)  06/16/15 278 lb (126.1 kg)    Physical Exam  Constitutional: She appears well-developed and well-nourished. No distress.  Morbidly obese; weight down 5+ pounds over last 5 weeks  Eyes: EOM are normal. No scleral icterus.  Neck: No thyromegaly present.  Cardiovascular: Normal rate.   Pulmonary/Chest: Effort normal.  Abdominal: She exhibits no distension.  Musculoskeletal: She exhibits no edema.  Skin: No pallor.  Psychiatric: She has a normal mood and affect. Her behavior is normal. Judgment and thought content normal.      Assessment & Plan:   Problem List Items Addressed This Visit      Cardiovascular and Mediastinum   Essential hypertension, benign    Weight loss key to getting pressure under control        Other   Prediabetes - Primary    Check A1c today; weight loss is key to slow or prevent progression to outright  diabetes mellitus      Relevant Orders   Hemoglobin A1c (Completed)   Lipid panel (Completed)   Obesity, Class III, BMI 40-49.9 (morbid obesity) ([place])    So glad patient is working on weight loss by eating healthier and being active; encouragement given      Encounter for medication monitoring    Check Na+, Mg2+, especially with her leg cramps; advised that she may be drinking too much water; safe limits discussed, risk of excessive H2O explained      Relevant Orders   BASIC METABOLIC PANEL WITH GFR (Completed)   Magnesium (Completed)    Other Visit Diagnoses   None.      Follow up plan: Return in about 6 months (around 06/16/2016) for visit and fasting labs.  An after-visit summary was printed and given to the patient at [place].  Please see the patient instructions which may  contain other information and recommendations beyond what is mentioned above in the assessment and plan.  No orders of the defined types were placed in this encounter.   Orders Placed This Encounter  Procedures  . Hemoglobin A1c  . Lipid panel  . BASIC METABOLIC PANEL WITH GFR  . Magnesium

## 2015-12-18 NOTE — Patient Instructions (Addendum)
I do recommend yearly flu shots; for individuals who don't want flu shots, try to practice excellent hand hygiene, and avoid nursing homes, day cares, and hospitals during peak flu season; taking additional vitamin C daily during flu/cold season may help boost your immune system too  Keep up the great effort you are making at weight loss Check out the information at familydoctor.org entitled "Nutrition for Weight Loss: What You Need to Know about Fad Diets" Try to lose between 1-2 pounds per week by taking in fewer calories and burning off more calories You can succeed by limiting portions, limiting foods dense in calories and fat, becoming more active, and drinking 8 glasses of water a day (64 ounces) Don't skip meals, especially breakfast, as skipping meals may alter your metabolism Do not use over-the-counter weight loss pills or gimmicks that claim rapid weight loss A healthy BMI (or body mass index) is between 18.5 and 24.9 You can calculate your ideal BMI at the [place] website ClubMonetize.fr  If you have not heard anything from my staff in a week about any orders/referrals/studies from today, please contact us here to follow-up [phone]

## 2015-12-19 LAB — HEMOGLOBIN A1C
HEMOGLOBIN A1C: 5.7 % — AB (ref <5.7)
Mean Plasma Glucose: 117 mg/dL

## 2015-12-20 NOTE — Assessment & Plan Note (Signed)
Weight loss key to getting pressure under control

## 2015-12-20 NOTE — Assessment & Plan Note (Signed)
So glad patient is working on weight loss by eating healthier and being active; encouragement given

## 2016-05-20 ENCOUNTER — Other Ambulatory Visit: Payer: Self-pay | Admitting: Family Medicine

## 2016-05-20 DIAGNOSIS — I10 Essential (primary) hypertension: Secondary | ICD-10-CM

## 2016-05-20 NOTE — Telephone Encounter (Signed)
Requesting refill on hydrochlorothiazide 25mg . Please send to [email or web address]. Have 3 pills left [phone]

## 2016-05-21 ENCOUNTER — Other Ambulatory Visit: Payer: Self-pay

## 2016-05-21 MED ORDER — HYDROCHLOROTHIAZIDE 25 MG PO TABS: 25.0000 mg | ORAL_TABLET | Freq: Every day | ORAL | 2 refills | Status: DC

## 2016-05-21 NOTE — Telephone Encounter (Signed)
Last -lytes reviewed

## 2016-09-07 ENCOUNTER — Other Ambulatory Visit: Payer: Self-pay | Admitting: Family Medicine

## 2016-09-07 DIAGNOSIS — M25561 Pain in right knee: Secondary | ICD-10-CM

## 2016-12-03 ENCOUNTER — Encounter: Payer: Commercial Managed Care - HMO | Admitting: Family Medicine

## 2017-02-03 ENCOUNTER — Other Ambulatory Visit: Payer: Self-pay | Admitting: Family Medicine

## 2017-02-03 ENCOUNTER — Telehealth: Payer: Self-pay

## 2017-02-03 DIAGNOSIS — Z1239 Encounter for other screening for malignant neoplasm of breast: Secondary | ICD-10-CM

## 2017-02-03 NOTE — Telephone Encounter (Signed)
Patient needs an order for her mammogram. Please sign.

## 2017-02-03 NOTE — Telephone Encounter (Signed)
Patient has upcoming appt 

## 2017-02-17 ENCOUNTER — Ambulatory Visit: Payer: 59 | Admitting: Family Medicine

## 2017-02-17 ENCOUNTER — Encounter: Payer: Self-pay | Admitting: Family Medicine

## 2017-02-17 VITALS — BP 140/72 | HR 98 | Temp 98.0°F | Ht 65.0 in | Wt 277.0 lb

## 2017-02-17 DIAGNOSIS — R252 Cramp and spasm: Secondary | ICD-10-CM

## 2017-02-17 DIAGNOSIS — Z1231 Encounter for screening mammogram for malignant neoplasm of breast: Secondary | ICD-10-CM | POA: Diagnosis not present

## 2017-02-17 DIAGNOSIS — R7303 Prediabetes: Secondary | ICD-10-CM | POA: Diagnosis not present

## 2017-02-17 DIAGNOSIS — Z5181 Encounter for therapeutic drug level monitoring: Secondary | ICD-10-CM

## 2017-02-17 DIAGNOSIS — I10 Essential (primary) hypertension: Secondary | ICD-10-CM

## 2017-02-17 DIAGNOSIS — Z0000 Encounter for general adult medical examination without abnormal findings: Secondary | ICD-10-CM

## 2017-02-17 MED ORDER — AMLODIPINE BESYLATE 2.5 MG PO TABS: 2.5000 mg | ORAL_TABLET | Freq: Every day | ORAL | 1 refills | Status: DC

## 2017-02-17 NOTE — Assessment & Plan Note (Signed)
Down 9 pounds since last year; drinking water; problem solving encourageed

## 2017-02-17 NOTE — Assessment & Plan Note (Signed)
Check glucose and A1c (glucose is not fasting,patient will come back another day fasting)

## 2017-02-17 NOTE — Assessment & Plan Note (Signed)
Continue the HCTZ; add low dose CCB; avoid decongestants; work on weight loss, limit salt, etc

## 2017-02-17 NOTE — Assessment & Plan Note (Signed)
Check liver and kidneys 

## 2017-02-17 NOTE — Progress Notes (Signed)
BP 140/72 (BP Location: Left Arm, Patient Position: Sitting, Cuff Size: Large)   Pulse 98   Temp 98 F (36.7 C) (Oral)   Ht 5\' 5"  (1.651 m)   Wt 277 lb (125.6 kg)   SpO2 (!) 86%   BMI 46.10 kg/m    Subjective:    Patient ID: Amber Murray, female    DOB: 12-05-1960, 56 y.o.   MRN: [id number]  HPI: Amber Murray is a 56 y.o. female  Chief Complaint  Patient presents with  . Hypertension    Pt states bottom has been running 90s   . Epistaxis  . Leg Pain    cramps, abdomen as well     HPI Patient is here for f/u High blood pressure; runs in the family Does not add a lot of salt to her food Cut back on the [facility], cut back on fast food No decongestants; no black licorice Pretty good handle on stress Sleeps very well No snoring or sleep apnea Constantly moving all day Smokes occasionally; does not need help with quitting Gets cramps often; on the HCTZ Prediabetes; no fam hx of diabetes; cut out [facility]; does drink fruit juice; unsweetened tea; wheat bread, butting back  Depression screen [facility] 2/9 02/17/2017 11/13/2015 04/28/2015  Decreased Interest 0 0 0  Down, Depressed, Hopeless 0 0 0  PHQ - 2 Score 0 0 0    Relevant past medical, surgical, family and social history reviewed Past Medical History:  Diagnosis Date  . GERD (gastroesophageal reflux disease)    occasional   . Hypertension   . Obesity   . Prediabetes 11/13/2015  . Vertigo    noo episodes in several yrs  . Wears contact lenses    sometimes   Past Surgical History:  Procedure Laterality Date  . CHOLECYSTECTOMY     Family History  Problem Relation Age of Onset  . Kidney disease Mother   . Hypertension Mother   . Cancer Sister        breast  . Breast cancer Sister 77  . Cancer Brother        colon  . Kidney disease Brother   . Hypertension Father    Social History   Socioeconomic History  . Marital status: Married    Spouse name: Not on file  . Number of  children: Not on file  . Years of education: Not on file  . Highest education level: Not on file  Social Needs  . Financial resource strain: Not on file  . Food insecurity - worry: Not on file  . Food insecurity - inability: Not on file  . Transportation needs - medical: Not on file  . Transportation needs - non-medical: Not on file  Occupational History  . Not on file  Tobacco Use  . Smoking status: Former Smoker    Last attempt to quit: 04/27/2005    Years since quitting: 11.8  . Smokeless tobacco: Never Used  Substance and Sexual Activity  . Alcohol use: Yes    Alcohol/week: 1.2 oz    Types: 2 Shots of liquor per week  . Drug use: Yes    Types: Marijuana    Comment: 1x/mo - last time 06/09/15  . Sexual activity: Yes  Other Topics Concern  . Not on file  Social History Narrative  . Not on file   Interim medical history since last visit reviewed. Allergies and medications reviewed  Review of Systems Per HPI unless specifically indicated above  Objective:    BP 140/72 (BP Location: Left Arm, Patient Position: Sitting, Cuff Size: Large)   Pulse 98   Temp 98 F (36.7 C) (Oral)   Ht 5\' 5"  (1.651 m)   Wt 277 lb (125.6 kg)   SpO2 (!) 86%   BMI 46.10 kg/m   Wt Readings from Last 3 Encounters:  02/17/17 277 lb (125.6 kg)  12/18/15 280 lb 9 oz (127.3 kg)  11/13/15 286 lb (129.7 kg)    Physical Exam  Constitutional: She appears well-developed and well-nourished. No distress.  Morbidly obese; weight down 9 pounds over last year  Eyes: EOM are normal. No scleral icterus.  Neck: No thyromegaly present.  Cardiovascular: Normal rate.  Pulmonary/Chest: Effort normal.  Abdominal: Bowel sounds are normal. She exhibits no distension.  Musculoskeletal: She exhibits no edema.  Skin: No pallor.  Psychiatric: She has a normal mood and affect. Her behavior is normal. Judgment and thought content normal.    Results for orders placed or performed in visit on 12/18/15    Hemoglobin A1c  Result Value Ref Range   Hgb A1c MFr Bld 5.7 (H) <5.7 %   Mean Plasma Glucose 117 mg/dL  Lipid panel  Result Value Ref Range   Cholesterol 181 125 - 200 mg/dL   Triglycerides 156 (H) <150 mg/dL   HDL 44 (L) >=46 mg/dL   Total CHOL/HDL Ratio 4.1 <=5.0 Ratio   VLDL 31 (H) <30 mg/dL   LDL Cholesterol 106 <130 mg/dL  BASIC METABOLIC PANEL WITH GFR  Result Value Ref Range   Sodium 139 135 - 146 mmol/L   Potassium 4.0 3.5 - 5.3 mmol/L   Chloride 101 98 - 110 mmol/L   CO2 29 20 - 31 mmol/L   Glucose, Bld 93 65 - 99 mg/dL   BUN 21 7 - 25 mg/dL   Creat 0.86 0.50 - 1.05 mg/dL   Calcium 9.4 8.6 - 10.4 mg/dL   GFR, Est African American 88 >=60 mL/min   GFR, Est Non African American 76 >=60 mL/min  Magnesium  Result Value Ref Range   Magnesium 2.0 1.5 - 2.5 mg/dL      Assessment & Plan:   Problem List Items Addressed This Visit      Cardiovascular and Mediastinum   Essential hypertension, benign    Continue the HCTZ; add low dose CCB; avoid decongestants; work on weight loss, limit salt, etc      Relevant Medications   amLODipine (NORVASC) 2.5 MG tablet     Other   Prediabetes    Check glucose and A1c (glucose is not fasting,patient will come back another day fasting)      Relevant Orders   Hemoglobin A1c   Obesity, Class III, BMI 40-49.9 (morbid obesity) (HCC)    Down 9 pounds since last year; drinking water; problem solving encourageed      Medication monitoring encounter    Check liver and kidneys      Leg cramps    Check K+ and Mg2+      Annual physical exam    Will get fasting labs on another day and return for complete physical part      Relevant Orders   COMPLETE METABOLIC PANEL WITH GFR   CBC with Differential/Platelet   Lipid panel   TSH    Other Visit Diagnoses    Encounter for screening mammogram for breast cancer    -  Primary   Relevant Orders   MM DIGITAL SCREENING BILATERAL  Follow up plan: Return in about 3 weeks  (around 03/10/2017) for complete physical.  An after-visit summary was printed and given to the patient at [place].  Please see the patient instructions which may contain other information and recommendations beyond what is mentioned above in the assessment and plan.  Meds ordered this encounter  Medications  . amLODipine (NORVASC) 2.5 MG tablet    Sig: Take 1 tablet (2.5 mg total) daily by mouth.    Dispense:  90 tablet    Refill:  1    Orders Placed This Encounter  Procedures  . MM DIGITAL SCREENING BILATERAL  . COMPLETE METABOLIC PANEL WITH GFR  . CBC with Differential/Platelet  . Lipid panel  . Hemoglobin A1c  . TSH

## 2017-02-17 NOTE — Assessment & Plan Note (Signed)
Check K+ and Mg2+ 

## 2017-02-17 NOTE — Assessment & Plan Note (Signed)
Will get fasting labs on another day and return for complete physical part

## 2017-02-17 NOTE — Patient Instructions (Addendum)
DASH Eating Plan DASH stands for "Dietary Approaches to Stop Hypertension." The DASH eating plan is a healthy eating plan that has been shown to reduce high blood pressure (hypertension). It may also reduce your risk for type 2 diabetes, heart disease, and stroke. The DASH eating plan may also help with weight loss. What are tips for following this plan? General guidelines  Avoid eating more than 2,300 mg (milligrams) of salt (sodium) a day. If you have hypertension, you may need to reduce your sodium intake to 1,500 mg a day.  Limit alcohol intake to no more than 1 drink a day for nonpregnant women and 2 drinks a day for men. One drink equals 12 oz of beer, 5 oz of wine, or 1 oz of hard liquor.  Work with your health care provider to maintain a healthy body weight or to lose weight. Ask what an ideal weight is for you.  Get at least 30 minutes of exercise that causes your heart to beat faster (aerobic exercise) most days of the week. Activities may include walking, swimming, or biking.  Work with your health care provider or diet and nutrition specialist (dietitian) to adjust your eating plan to your individual calorie needs. Reading food labels  Check food labels for the amount of sodium per serving. Choose foods with less than 5 percent of the Daily Value of sodium. Generally, foods with less than 300 mg of sodium per serving fit into this eating plan.  To find whole grains, look for the word "whole" as the first word in the ingredient list. Shopping  Buy products labeled as "low-sodium" or "no salt added."  Buy fresh foods. Avoid canned foods and premade or frozen meals. Cooking  Avoid adding salt when cooking. Use salt-free seasonings or herbs instead of table salt or sea salt. Check with your health care provider or pharmacist before using salt substitutes.  Do not fry foods. Cook foods using healthy methods such as baking, boiling, grilling, and broiling instead.  Cook with  heart-healthy oils, such as olive, canola, soybean, or sunflower oil. Meal planning   Eat a balanced diet that includes: ? 5 or more servings of fruits and vegetables each day. At each meal, try to fill half of your plate with fruits and vegetables. ? Up to 6-8 servings of whole grains each day. ? Less than 6 oz of lean meat, poultry, or fish each day. A 3-oz serving of meat is about the same size as a deck of cards. One egg equals 1 oz. ? 2 servings of low-fat dairy each day. ? A serving of nuts, seeds, or beans 5 times each week. ? Heart-healthy fats. Healthy fats called Omega-3 fatty acids are found in foods such as flaxseeds and coldwater fish, like sardines, salmon, and mackerel.  Limit how much you eat of the following: ? Canned or prepackaged foods. ? Food that is high in trans fat, such as fried foods. ? Food that is high in saturated fat, such as fatty meat. ? Sweets, desserts, sugary drinks, and other foods with added sugar. ? Full-fat dairy products.  Do not salt foods before eating.  Try to eat at least 2 vegetarian meals each week.  Eat more home-cooked food and less restaurant, buffet, and fast food.  When eating at a restaurant, ask that your food be prepared with less salt or no salt, if possible. What foods are recommended? The items listed may not be a complete list. Talk with your dietitian about what   dietary choices are best for you. Grains Whole-grain or whole-wheat bread. Whole-grain or whole-wheat pasta. Brown rice. Oatmeal. Quinoa. Bulgur. Whole-grain and low-sodium cereals. Pita bread. Low-fat, low-sodium crackers. Whole-wheat flour tortillas. Vegetables Fresh or frozen vegetables (raw, steamed, roasted, or grilled). Low-sodium or reduced-sodium tomato and vegetable juice. Low-sodium or reduced-sodium tomato sauce and tomato paste. Low-sodium or reduced-sodium canned vegetables. Fruits All fresh, dried, or frozen fruit. Canned fruit in natural juice (without  added sugar). Meat and other protein foods Skinless chicken or turkey. Ground chicken or turkey. Pork with fat trimmed off. Fish and seafood. Egg whites. Dried beans, peas, or lentils. Unsalted nuts, nut butters, and seeds. Unsalted canned beans. Lean cuts of beef with fat trimmed off. Low-sodium, lean deli meat. Dairy Low-fat (1%) or fat-free (skim) milk. Fat-free, low-fat, or reduced-fat cheeses. Nonfat, low-sodium ricotta or cottage cheese. Low-fat or nonfat yogurt. Low-fat, low-sodium cheese. Fats and oils Soft margarine without trans fats. Vegetable oil. Low-fat, reduced-fat, or light mayonnaise and salad dressings (reduced-sodium). Canola, safflower, olive, soybean, and sunflower oils. Avocado. Seasoning and other foods Herbs. Spices. Seasoning mixes without salt. Unsalted popcorn and pretzels. Fat-free sweets. What foods are not recommended? The items listed may not be a complete list. Talk with your dietitian about what dietary choices are best for you. Grains Baked goods made with fat, such as croissants, muffins, or some breads. Dry pasta or rice meal packs. Vegetables Creamed or fried vegetables. Vegetables in a cheese sauce. Regular canned vegetables (not low-sodium or reduced-sodium). Regular canned tomato sauce and paste (not low-sodium or reduced-sodium). Regular tomato and vegetable juice (not low-sodium or reduced-sodium). Pickles. Olives. Fruits Canned fruit in a light or heavy syrup. Fried fruit. Fruit in cream or butter sauce. Meat and other protein foods Fatty cuts of meat. Ribs. Fried meat. Bacon. Sausage. Bologna and other processed lunch meats. Salami. Fatback. Hotdogs. Bratwurst. Salted nuts and seeds. Canned beans with added salt. Canned or smoked fish. Whole eggs or egg yolks. Chicken or turkey with skin. Dairy Whole or 2% milk, cream, and half-and-half. Whole or full-fat cream cheese. Whole-fat or sweetened yogurt. Full-fat cheese. Nondairy creamers. Whipped toppings.  Processed cheese and cheese spreads. Fats and oils Butter. Stick margarine. Lard. Shortening. Ghee. Bacon fat. Tropical oils, such as coconut, palm kernel, or palm oil. Seasoning and other foods Salted popcorn and pretzels. Onion salt, garlic salt, seasoned salt, table salt, and sea salt. Worcestershire sauce. Tartar sauce. Barbecue sauce. Teriyaki sauce. Soy sauce, including reduced-sodium. Steak sauce. Canned and packaged gravies. Fish sauce. Oyster sauce. Cocktail sauce. Horseradish that you find on the shelf. Ketchup. Mustard. Meat flavorings and tenderizers. Bouillon cubes. Hot sauce and Tabasco sauce. Premade or packaged marinades. Premade or packaged taco seasonings. Relishes. Regular salad dressings. Where to find more information:  National Heart, Lung, and Blood Institute: www.nhlbi.nih.gov  American Heart Association: www.heart.org Summary  The DASH eating plan is a healthy eating plan that has been shown to reduce high blood pressure (hypertension). It may also reduce your risk for type 2 diabetes, heart disease, and stroke.  With the DASH eating plan, you should limit salt (sodium) intake to 2,300 mg a day. If you have hypertension, you may need to reduce your sodium intake to 1,500 mg a day.  When on the DASH eating plan, aim to eat more fresh fruits and vegetables, whole grains, lean proteins, low-fat dairy, and heart-healthy fats.  Work with your health care provider or diet and nutrition specialist (dietitian) to adjust your eating plan to your individual   calorie needs. This information is not intended to replace advice given to you by your health care provider. Make sure you discuss any questions you have with your health care provider. Document Released: 03/14/2011 Document Revised: 03/18/2016 Document Reviewed: 03/18/2016 Elsevier Interactive Patient Education  2017 Elsevier Inc.  

## 2017-03-04 ENCOUNTER — Telehealth: Payer: Self-pay | Admitting: Family Medicine

## 2017-03-04 NOTE — Telephone Encounter (Signed)
Please remind patient to get the fasting labs done soon Thank you

## 2017-03-04 NOTE — Telephone Encounter (Signed)
Called pt informed her of the need to come in and have labs done. Pt gave verbal understanding.

## 2017-03-11 LAB — COMPLETE METABOLIC PANEL WITH GFR
AG Ratio: 1.4 (calc) (ref 1.0–2.5)
ALBUMIN MSPROF: 4 g/dL (ref 3.6–5.1)
ALKALINE PHOSPHATASE (APISO): 91 U/L (ref 33–130)
ALT: 21 U/L (ref 6–29)
AST: 22 U/L (ref 10–35)
BILIRUBIN TOTAL: 0.4 mg/dL (ref 0.2–1.2)
BUN: 18 mg/dL (ref 7–25)
CHLORIDE: 106 mmol/L (ref 98–110)
CO2: 27 mmol/L (ref 20–32)
CREATININE: 0.74 mg/dL (ref 0.50–1.05)
Calcium: 9.1 mg/dL (ref 8.6–10.4)
GFR, EST AFRICAN AMERICAN: 105 mL/min/{1.73_m2} (ref >=60)
GFR, Est Non African American: 91 mL/min/{1.73_m2} (ref >=60)
GLUCOSE: 105 mg/dL — AB (ref 65–99)
Globulin: 2.8 g/dL (calc) (ref 1.9–3.7)
Potassium: 3.9 mmol/L (ref 3.5–5.3)
Sodium: 140 mmol/L (ref 135–146)
TOTAL PROTEIN: 6.8 g/dL (ref 6.1–8.1)

## 2017-03-11 LAB — CBC WITH DIFFERENTIAL/PLATELET
BASOS ABS: 50 {cells}/uL (ref 0–200)
BASOS PCT: 1.2 %
EOS ABS: 151 {cells}/uL (ref 15–500)
Eosinophils Relative: 3.6 %
HEMATOCRIT: 37.6 % (ref 35.0–45.0)
HEMOGLOBIN: 12.7 g/dL (ref 11.7–15.5)
Lymphs Abs: 1831 cells/uL (ref 850–3900)
MCH: 29.9 pg (ref 27.0–33.0)
MCHC: 33.8 g/dL (ref 32.0–36.0)
MCV: 88.5 fL (ref 80.0–100.0)
MPV: 10.2 fL (ref 7.5–12.5)
Monocytes Relative: 7.9 %
Neutro Abs: 1835 cells/uL (ref 1500–7800)
Neutrophils Relative %: 43.7 %
Platelets: 193 10*3/uL (ref 140–400)
RBC: 4.25 10*6/uL (ref 3.80–5.10)
RDW: 12 % (ref 11.0–15.0)
Total Lymphocyte: 43.6 %
WBC: 4.2 10*3/uL (ref 3.8–10.8)
WBCMIX: 332 {cells}/uL (ref 200–950)

## 2017-03-11 LAB — HEMOGLOBIN A1C
EAG (MMOL/L): 6.3 (calc)
Hgb A1c MFr Bld: 5.6 % of total Hgb (ref <5.7)
MEAN PLASMA GLUCOSE: 114 (calc)

## 2017-03-11 LAB — LIPID PANEL
CHOL/HDL RATIO: 3.4 (calc) (ref <5.0)
CHOLESTEROL: 172 mg/dL (ref <200)
HDL: 51 mg/dL (ref >50)
LDL CHOLESTEROL (CALC): 104 mg/dL — AB
NON-HDL CHOLESTEROL (CALC): 121 mg/dL (ref <130)
Triglycerides: 78 mg/dL (ref <150)

## 2017-03-11 LAB — TSH: TSH: 1.29 mIU/L (ref 0.40–4.50)

## 2017-03-24 ENCOUNTER — Ambulatory Visit (INDEPENDENT_AMBULATORY_CARE_PROVIDER_SITE_OTHER): Payer: 59 | Admitting: Family Medicine

## 2017-03-24 ENCOUNTER — Encounter: Payer: Self-pay | Admitting: Family Medicine

## 2017-03-24 VITALS — BP 118/82 | HR 96 | Temp 98.3°F | Ht 66.03 in | Wt 266.8 lb

## 2017-03-24 DIAGNOSIS — Z124 Encounter for screening for malignant neoplasm of cervix: Secondary | ICD-10-CM

## 2017-03-24 DIAGNOSIS — Z0000 Encounter for general adult medical examination without abnormal findings: Secondary | ICD-10-CM | POA: Diagnosis not present

## 2017-03-24 NOTE — Progress Notes (Signed)
Patient ID: Amber Murray, female   DOB: [birth year], 56 y.o.   MRN: [id number]   Subjective:   Amber Murray is a 56 y.o. female here for a complete physical exam  Interim issues since last visit: none reported; she is working on weight loss  USPSTF grade A and B recommendations Depression:  Depression screen [facility] 2/9 03/24/2017 02/17/2017 11/13/2015 04/28/2015  Decreased Interest 0 0 0 0  Down, Depressed, Hopeless 0 0 0 0  PHQ - 2 Score 0 0 0 0   Hypertension: BP Readings from Last 3 Encounters:  03/24/17 118/82  02/17/17 140/72  12/18/15 128/84   Obesity: Wt Readings from Last 3 Encounters:  03/24/17 266 lb 12.8 oz (121 kg)  02/17/17 277 lb (125.6 kg)  12/18/15 280 lb 9 oz (127.3 kg)   BMI Readings from Last 3 Encounters:  03/24/17 43.03 kg/m  02/17/17 46.10 kg/m  12/18/15 46.69 kg/m    Skin cancer: no worrisome moles Lung cancer:  n/a Breast cancer: mammo due Colorectal cancer: done 2017; due 2022  BRCA gene screening: family hx of breast and/or ovarian cancer and/or metastatic prostate cancer? Sister had breast cancer, sister is 40 year survivor Cervical cancer screening: no abnormal; every 3-5 HIV, hep B, hep C: no need STD testing and prevention (chl/gon/syphilis): no need Intimate partner violence: no abuse Contraception: n/a Osteoporosis: no risk factors Fall prevention/vitamin D: discussed  Diet: getting calcium; cutting back on bacon and sausage; oatmeal Exercise: super active now, no CP Alcohol: occasional, less than 7 drinks a week Tobacco use: was smoking, quit 10 days Aspirin: baby aspirin daily Lipids:  Lab Results  Component Value Date   CHOL 172 03/10/2017   CHOL 181 12/18/2015   CHOL 161 05/04/2015   Lab Results  Component Value Date   HDL 51 03/10/2017   HDL 44 (L) 12/18/2015   HDL 53 05/04/2015   Lab Results  Component Value Date   LDLCALC 106 12/18/2015   [place] 95 05/04/2015   Lab Results  Component Value  Date   TRIG 78 03/10/2017   TRIG 156 (H) 12/18/2015   TRIG 65 05/04/2015   Lab Results  Component Value Date   CHOLHDL 3.4 03/10/2017   CHOLHDL 4.1 12/18/2015   CHOLHDL 3.0 05/04/2015   No results found for: LDLDIRECT Glucose:  Glucose, Bld  Date Value Ref Range Status  03/10/2017 105 (H) 65 - 99 mg/dL Final    Comment:    .            Fasting reference interval . For someone without known diabetes, a glucose value between 100 and 125 mg/dL is consistent with prediabetes and should be confirmed with a follow-up test. .   12/18/2015 93 65 - 99 mg/dL Final  11/13/2015 88 65 - 99 mg/dL Final   Past Medical History:  Diagnosis Date  . GERD (gastroesophageal reflux disease)    occasional   . Hypertension   . Obesity   . Prediabetes 11/13/2015  . Vertigo    noo episodes in several yrs  . Wears contact lenses    sometimes   Past Surgical History:  Procedure Laterality Date  . CHOLECYSTECTOMY    . COLONOSCOPY WITH PROPOFOL N/A 06/16/2015   Procedure: COLONOSCOPY WITH PROPOFOL;  Surgeon: Lucilla Lame, MD;  Location: [place];  Service: Endoscopy;  Laterality: N/A;  . POLYPECTOMY  06/16/2015   Procedure: POLYPECTOMY INTESTINAL;  Surgeon: Lucilla Lame, MD;  Location: [place];  Service: Endoscopy;;  Sigmoid colon polyp   Family History  Problem Relation Age of Onset  . Kidney disease Mother   . Hypertension Mother   . Cancer Sister        breast  . Breast cancer Sister 63  . Cancer Brother        colon  . Kidney disease Brother   . Hypertension Father   . Cerebral palsy Brother   . Colon cancer Brother   . HIV/AIDS Brother    Social History   Tobacco Use  . Smoking status: Former Smoker    Last attempt to quit: 04/27/2005    Years since quitting: 11.9  . Smokeless tobacco: Never Used  Substance Use Topics  . Alcohol use: Yes    Alcohol/week: 1.2 oz    Types: 2 Shots of liquor per week  . Drug use: Yes    Types: Marijuana    Comment:  1x/mo - last time 06/09/15   Review of Systems  Constitutional: Negative for fever and unexpected weight change (she's working and super active).  HENT: Negative for sore throat.   Eyes: Negative for visual disturbance.  Respiratory: Negative for shortness of breath and wheezing.   Cardiovascular: Negative for chest pain.  Gastrointestinal: Negative for blood in stool.  Endocrine: Negative for polydipsia.  Genitourinary: Negative for hematuria.  Musculoskeletal: Negative for arthralgias.  Skin: Negative for rash and wound.  Neurological: Negative for tremors.  Hematological: Negative for adenopathy. Does not bruise/bleed easily.  Psychiatric/Behavioral: Negative for dysphoric mood.    Objective:   Vitals:   03/24/17 1502  BP: 118/82  Pulse: 96  Temp: 98.3 F (36.8 C)  TempSrc: Oral  SpO2: 99%  Weight: 266 lb 12.8 oz (121 kg)  Height: 5' 6.03" (1.677 m)   Body mass index is 43.03 kg/m. Wt Readings from Last 3 Encounters:  03/24/17 266 lb 12.8 oz (121 kg)  02/17/17 277 lb (125.6 kg)  12/18/15 280 lb 9 oz (127.3 kg)   Physical Exam  Constitutional: She appears well-developed and well-nourished.  HENT:  Head: Normocephalic and atraumatic.  Eyes: Conjunctivae and EOM are normal. Right eye exhibits no hordeolum. Left eye exhibits no hordeolum. No scleral icterus.  Neck: Carotid bruit is not present. No thyromegaly present.  Cardiovascular: Normal rate, regular rhythm, S1 normal, S2 normal and normal heart sounds.  No extrasystoles are present.  Pulmonary/Chest: Effort normal and breath sounds normal. No respiratory distress. Right breast exhibits no inverted nipple, no mass, no nipple discharge, no skin change and no tenderness. Left breast exhibits no inverted nipple, no mass, no nipple discharge, no skin change and no tenderness. Breasts are symmetrical.  Abdominal: Soft. Normal appearance and bowel sounds are normal. She exhibits no distension, no abdominal bruit, no  pulsatile midline mass and no mass. There is no hepatosplenomegaly. There is no tenderness. No hernia.  Genitourinary: Uterus normal. Pelvic exam was performed with patient prone. There is no rash or lesion on the right labia. There is no rash or lesion on the left labia. Cervix exhibits no motion tenderness. Right adnexum displays no mass, no tenderness and no fullness. Left adnexum displays no mass, no tenderness and no fullness.  Musculoskeletal: Normal range of motion. She exhibits no edema.  Lymphadenopathy:       Head (right side): No submandibular adenopathy present.       Head (left side): No submandibular adenopathy present.    She has no cervical adenopathy.    She has no axillary adenopathy.  Neurological:  She is alert. She displays no tremor. No cranial nerve deficit. She exhibits normal muscle tone. Gait normal.  Skin: Skin is warm and dry. No bruising and no ecchymosis noted. No cyanosis. No pallor.  Psychiatric: Her speech is normal and behavior is normal. Thought content normal. Her mood appears not anxious. She does not exhibit a depressed mood.    Assessment/Plan:   Problem List Items Addressed This Visit      Other   Annual physical exam - Primary    USPSTF grade A and B recommendations reviewed with patient; age-appropriate recommendations, preventive care, screening tests, etc discussed and encouraged; healthy living encouraged; see AVS for patient education given to patient       Other Visit Diagnoses    Pap smear for cervical cancer screening       Relevant Orders   Pap IG and HPV (high risk) DNA detection       No orders of the defined types were placed in this encounter.  No orders of the defined types were placed in this encounter.   Follow up plan: Return in about 1 year (around 03/24/2018) for complete physical.  An After Visit Summary was printed and given to the patient.

## 2017-03-24 NOTE — Patient Instructions (Addendum)
Please do call to schedule your mammogram; the number to schedule one at either [facility] or [facility] is [phone] Health Maintenance, Female Adopting a healthy lifestyle and getting preventive care can go a long way to promote health and wellness. Talk with your health care provider about what schedule of regular examinations is right for you. This is a good chance for you to check in with your provider about disease prevention and staying healthy. In between checkups, there are plenty of things you can do on your own. Experts have done a lot of research about which lifestyle changes and preventive measures are most likely to keep you healthy. Ask your health care provider for more information. Weight and diet Eat a healthy diet  Be sure to include plenty of vegetables, fruits, low-fat dairy products, and lean protein.  Do not eat a lot of foods high in solid fats, added sugars, or salt.  Get regular exercise. This is one of the most important things you can do for your health. ? Most adults should exercise for at least 150 minutes each week. The exercise should increase your heart rate and make you sweat (moderate-intensity exercise). ? Most adults should also do strengthening exercises at least twice a week. This is in addition to the moderate-intensity exercise.  Maintain a healthy weight  Body mass index (BMI) is a measurement that can be used to identify possible weight problems. It estimates body fat based on height and weight. Your health care provider can help determine your BMI and help you achieve or maintain a healthy weight.  For females 28 years of age and older: ? A BMI below 18.5 is considered underweight. ? A BMI of 18.5 to 24.9 is normal. ? A BMI of 25 to 29.9 is considered overweight. ? A BMI of 30 and above is considered obese.  Watch levels of cholesterol and blood lipids  You should start having your blood tested for lipids and  cholesterol at 56 years of age, then have this test every 5 years.  You may need to have your cholesterol levels checked more often if: ? Your lipid or cholesterol levels are high. ? You are older than 56 years of age. ? You are at high risk for heart disease.  Cancer screening Lung Cancer  Lung cancer screening is recommended for adults 56-72 years old who are at high risk for lung cancer because of a history of smoking.  A yearly low-dose CT scan of the lungs is recommended for people who: ? Currently smoke. ? Have quit within the past 15 years. ? Have at least a 30-pack-year history of smoking. A pack year is smoking an average of one pack of cigarettes a day for 1 year.  Yearly screening should continue until it has been 15 years since you quit.  Yearly screening should stop if you develop a health problem that would prevent you from having lung cancer treatment.  Breast Cancer  Practice breast self-awareness. This means understanding how your breasts normally appear and feel.  It also means doing regular breast self-exams. Let your health care provider know about any changes, no matter how small.  If you are in your 56s or 30s, you should have a clinical breast exam (CBE) by a health care provider every 1-3 years as part of a regular health exam.  If you are 56 or older, have a CBE every year. Also consider having a breast X-ray (mammogram) every year.  If  you have a family history of breast cancer, talk to your health care provider about genetic screening.  If you are at high risk for breast cancer, talk to your health care provider about having an MRI and a mammogram every year.  Breast cancer gene (BRCA) assessment is recommended for women who have family members with BRCA-related cancers. BRCA-related cancers include: ? Breast. ? Ovarian. ? Tubal. ? Peritoneal cancers.  Results of the assessment will determine the need for genetic counseling and BRCA1 and BRCA2  testing.  Cervical Cancer Your health care provider may recommend that you be screened regularly for cancer of the pelvic organs (ovaries, uterus, and vagina). This screening involves a pelvic examination, including checking for microscopic changes to the surface of your cervix (Pap test). You may be encouraged to have this screening done every 3 years, beginning at age 21.  For women ages 30-65, health care providers may recommend pelvic exams and Pap testing every 3 years, or they may recommend the Pap and pelvic exam, combined with testing for human papilloma virus (HPV), every 5 years. Some types of HPV increase your risk of cervical cancer. Testing for HPV may also be done on women of any age with unclear Pap test results.  Other health care providers may not recommend any screening for nonpregnant women who are considered low risk for pelvic cancer and who do not have symptoms. Ask your health care provider if a screening pelvic exam is right for you.  If you have had past treatment for cervical cancer or a condition that could lead to cancer, you need Pap tests and screening for cancer for at least 20 years after your treatment. If Pap tests have been discontinued, your risk factors (such as having a new sexual partner) need to be reassessed to determine if screening should resume. Some women have medical problems that increase the chance of getting cervical cancer. In these cases, your health care provider may recommend more frequent screening and Pap tests.  Colorectal Cancer  This type of cancer can be detected and often prevented.  Routine colorectal cancer screening usually begins at 56 years of age and continues through 56 years of age.  Your health care provider may recommend screening at an earlier age if you have risk factors for colon cancer.  Your health care provider may also recommend using home test kits to check for hidden blood in the stool.  A small camera at the end of a  tube can be used to examine your colon directly (sigmoidoscopy or colonoscopy). This is done to check for the earliest forms of colorectal cancer.  Routine screening usually begins at age 50.  Direct examination of the colon should be repeated every 5-10 years through 56 years of age. However, you may need to be screened more often if early forms of precancerous polyps or small growths are found.  Skin Cancer  Check your skin from head to toe regularly.  Tell your health care provider about any new moles or changes in moles, especially if there is a change in a mole's shape or color.  Also tell your health care provider if you have a mole that is larger than the size of a pencil eraser.  Always use sunscreen. Apply sunscreen liberally and repeatedly throughout the day.  Protect yourself by wearing long sleeves, pants, a wide-brimmed hat, and sunglasses whenever you are outside.  Heart disease, diabetes, and high blood pressure  High blood pressure causes heart disease and increases   the risk of stroke. High blood pressure is more likely to develop in: ? People who have blood pressure in the high end of the normal range (130-139/85-89 mm Hg). ? People who are overweight or obese. ? People who are African American.  If you are 18-39 years of age, have your blood pressure checked every 3-5 years. If you are 40 years of age or older, have your blood pressure checked every year. You should have your blood pressure measured twice-once when you are at a hospital or clinic, and once when you are not at a hospital or clinic. Record the average of the two measurements. To check your blood pressure when you are not at a hospital or clinic, you can use: ? An automated blood pressure machine at a pharmacy. ? A home blood pressure monitor.  If you are between 55 years and 79 years old, ask your health care provider if you should take aspirin to prevent strokes.  Have regular diabetes screenings. This  involves taking a blood sample to check your fasting blood sugar level. ? If you are at a normal weight and have a low risk for diabetes, have this test once every three years after 56 years of age. ? If you are overweight and have a high risk for diabetes, consider being tested at a younger age or more often. Preventing infection Hepatitis B  If you have a higher risk for hepatitis B, you should be screened for this virus. You are considered at high risk for hepatitis B if: ? You were born in a country where hepatitis B is common. Ask your health care provider which countries are considered high risk. ? Your parents were born in a high-risk country, and you have not been immunized against hepatitis B (hepatitis B vaccine). ? You have HIV or AIDS. ? You use needles to inject street drugs. ? You live with someone who has hepatitis B. ? You have had sex with someone who has hepatitis B. ? You get hemodialysis treatment. ? You take certain medicines for conditions, including cancer, organ transplantation, and autoimmune conditions.  Hepatitis C  Blood testing is recommended for: ? Everyone born from 1945 through 1965. ? Anyone with known risk factors for hepatitis C.  Sexually transmitted infections (STIs)  You should be screened for sexually transmitted infections (STIs) including gonorrhea and chlamydia if: ? You are sexually active and are younger than 56 years of age. ? You are older than 56 years of age and your health care provider tells you that you are at risk for this type of infection. ? Your sexual activity has changed since you were last screened and you are at an increased risk for chlamydia or gonorrhea. Ask your health care provider if you are at risk.  If you do not have HIV, but are at risk, it may be recommended that you take a prescription medicine daily to prevent HIV infection. This is called pre-exposure prophylaxis (PrEP). You are considered at risk if: ? You are  sexually active and do not regularly use condoms or know the HIV status of your partner(s). ? You take drugs by injection. ? You are sexually active with a partner who has HIV.  Talk with your health care provider about whether you are at high risk of being infected with HIV. If you choose to begin PrEP, you should first be tested for HIV. You should then be tested every 3 months for as long as you are taking PrEP.   Pregnancy  If you are premenopausal and you may become pregnant, ask your health care provider about preconception counseling.  If you may become pregnant, take 400 to 800 micrograms (mcg) of folic acid every day.  If you want to prevent pregnancy, talk to your health care provider about birth control (contraception). Osteoporosis and menopause  Osteoporosis is a disease in which the bones lose minerals and strength with aging. This can result in serious bone fractures. Your risk for osteoporosis can be identified using a bone density scan.  If you are 65 years of age or older, or if you are at risk for osteoporosis and fractures, ask your health care provider if you should be screened.  Ask your health care provider whether you should take a calcium or vitamin D supplement to lower your risk for osteoporosis.  Menopause may have certain physical symptoms and risks.  Hormone replacement therapy may reduce some of these symptoms and risks. Talk to your health care provider about whether hormone replacement therapy is right for you. Follow these instructions at home:  Schedule regular health, dental, and eye exams.  Stay current with your immunizations.  Do not use any tobacco products including cigarettes, chewing tobacco, or electronic cigarettes.  If you are pregnant, do not drink alcohol.  If you are breastfeeding, limit how much and how often you drink alcohol.  Limit alcohol intake to no more than 1 drink per day for nonpregnant women. One drink equals 12 ounces of  beer, 5 ounces of wine, or 1 ounces of hard liquor.  Do not use street drugs.  Do not share needles.  Ask your health care provider for help if you need support or information about quitting drugs.  Tell your health care provider if you often feel depressed.  Tell your health care provider if you have ever been abused or do not feel safe at home. This information is not intended to replace advice given to you by your health care provider. Make sure you discuss any questions you have with your health care provider. Document Released: 10/08/2010 Document Revised: 08/31/2015 Document Reviewed: 12/27/2014 Elsevier Interactive Patient Education  2018 [employer]  

## 2017-03-25 NOTE — Assessment & Plan Note (Signed)
USPSTF grade A and B recommendations reviewed with patient; age-appropriate recommendations, preventive care, screening tests, etc discussed and encouraged; healthy living encouraged; see AVS for patient education given to patient  

## 2017-03-27 LAB — PAP IG AND HPV HIGH-RISK: HPV DNA HIGH RISK: NOT DETECTED

## 2017-03-28 ENCOUNTER — Telehealth: Payer: Self-pay

## 2017-03-28 NOTE — Telephone Encounter (Signed)
-----   Message from Arnetha Courser, MD sent at 03/27/2017  2:38 PM EST ----- Please let pt know that her pap smear was normal; no sign of cancer, HPV negative; all good news; thank you

## 2017-03-28 NOTE — Telephone Encounter (Signed)
Called pt informed her of neg-neg PAP. Pt gave verbal understanding.

## 2017-05-04 ENCOUNTER — Other Ambulatory Visit: Payer: Self-pay | Admitting: Family Medicine

## 2017-05-04 DIAGNOSIS — I10 Essential (primary) hypertension: Secondary | ICD-10-CM

## 2017-05-05 NOTE — Telephone Encounter (Signed)
Last Cr reviewed; Rx approved 

## 2017-06-10 ENCOUNTER — Other Ambulatory Visit: Payer: Self-pay | Admitting: Family Medicine

## 2017-06-10 DIAGNOSIS — M25561 Pain in right knee: Secondary | ICD-10-CM

## 2017-06-10 NOTE — Telephone Encounter (Signed)
Reviewed Cr and approved Rx

## 2017-09-29 ENCOUNTER — Other Ambulatory Visit: Payer: Self-pay | Admitting: Family Medicine

## 2017-12-03 ENCOUNTER — Ambulatory Visit
Admission: RE | Admit: 2017-12-03 | Discharge: 2017-12-03 | Disposition: A | Discharge status: Home / Self Care | Payer: 59 | Source: Ambulatory Visit | Attending: Family Medicine | Admitting: Family Medicine

## 2017-12-03 DIAGNOSIS — Z1231 Encounter for screening mammogram for malignant neoplasm of breast: Secondary | ICD-10-CM | POA: Diagnosis not present

## 2018-04-07 ENCOUNTER — Other Ambulatory Visit: Payer: Self-pay | Admitting: Family Medicine

## 2018-04-07 NOTE — Telephone Encounter (Signed)
Patient has not been seen in over a year Please ask her to schedule a visit It appears she is also due for a physical Inform her that we can do both together, but it will be billed as two separate visits, so if she wants to schedule them separately or together, we just need her to know ahead of time; I suggest two separate visits so she gets the most of her time with me

## 2018-04-07 NOTE — Telephone Encounter (Signed)
Pt notified will call back to schedule

## 2018-04-29 ENCOUNTER — Encounter: Payer: Self-pay | Admitting: Family Medicine

## 2018-04-29 ENCOUNTER — Ambulatory Visit (INDEPENDENT_AMBULATORY_CARE_PROVIDER_SITE_OTHER): Payer: 59 | Admitting: Family Medicine

## 2018-04-29 VITALS — BP 116/78 | HR 68 | Temp 97.7°F | Ht 65.5 in | Wt 277.1 lb

## 2018-04-29 DIAGNOSIS — M79671 Pain in right foot: Secondary | ICD-10-CM | POA: Diagnosis not present

## 2018-04-29 DIAGNOSIS — E66813 Obesity, class 3: Secondary | ICD-10-CM

## 2018-04-29 DIAGNOSIS — R739 Hyperglycemia, unspecified: Secondary | ICD-10-CM

## 2018-04-29 DIAGNOSIS — Z0000 Encounter for general adult medical examination without abnormal findings: Secondary | ICD-10-CM

## 2018-04-29 DIAGNOSIS — I10 Essential (primary) hypertension: Secondary | ICD-10-CM

## 2018-04-29 DIAGNOSIS — Z800 Family history of malignant neoplasm of digestive organs: Secondary | ICD-10-CM

## 2018-04-29 DIAGNOSIS — G8929 Other chronic pain: Secondary | ICD-10-CM

## 2018-04-29 DIAGNOSIS — M25561 Pain in right knee: Secondary | ICD-10-CM

## 2018-04-29 DIAGNOSIS — M79672 Pain in left foot: Secondary | ICD-10-CM

## 2018-04-29 DIAGNOSIS — R7303 Prediabetes: Secondary | ICD-10-CM

## 2018-04-29 MED ORDER — MELOXICAM 15 MG PO TABS: ORAL_TABLET | ORAL | 1 refills | Status: DC

## 2018-04-29 MED ORDER — HYDROCHLOROTHIAZIDE 25 MG PO TABS: 25.0000 mg | ORAL_TABLET | Freq: Every day | ORAL | 3 refills | Status: DC

## 2018-04-29 NOTE — Assessment & Plan Note (Signed)
Check A1c; weight loss encouraged

## 2018-04-29 NOTE — Progress Notes (Signed)
BP 116/78   Pulse 68   Temp 97.7 F (36.5 C) (Oral)   Ht 5' 5.5" (1.664 m)   Wt 277 lb 1.6 oz (125.7 kg)   SpO2 98%   BMI 45.41 kg/m    Subjective:    Patient ID: Amber Murray, female    DOB: 02-07-1961, 58 y.o.   MRN: [id number]  HPI: Amber Murray is a 58 y.o. female  Chief Complaint  Patient presents with  . Annual Exam  . Foot Pain    bilateral    HPI Here for CPE and HTN and foot pain It's in the bilateral medial heels; trying different shoes; better with a little arch; insole support; spends a lot of time on feet; going on for 6 months; pretty constant; tried tylenol which seemed to help somewhat; some swelling; tried the meloxicam and that actually did help  HTN; controlled on amlodipine and HCTZ; getting good numbers away form the doctor; trying to limit salt  Elevated glucose; last glucose 105; no one in the family other than a nephew; no dry mouth CPE  USPSTF grade A and B recommendations Depression:  Depression screen [facility] 2/9 04/29/2018 03/24/2017 02/17/2017 11/13/2015 04/28/2015  Decreased Interest 0 0 0 0 0  Down, Depressed, Hopeless 0 0 0 0 0  PHQ - 2 Score 0 0 0 0 0  Altered sleeping 0 - - - -  Tired, decreased energy 0 - - - -  Change in appetite 0 - - - -  Feeling bad or failure about yourself  0 - - - -  Trouble concentrating 0 - - - -  Moving slowly or fidgety/restless 0 - - - -  Suicidal thoughts 0 - - - -  PHQ-9 Score 0 - - - -  Difficult doing work/chores Not difficult at all - - - -   Hypertension: BP Readings from Last 3 Encounters:  04/29/18 116/78  03/24/17 118/82  02/17/17 140/72   Obesity: Wt Readings from Last 3 Encounters:  04/29/18 277 lb 1.6 oz (125.7 kg)  03/24/17 266 lb 12.8 oz (121 kg)  02/17/17 277 lb (125.6 kg)   BMI Readings from Last 3 Encounters:  04/29/18 45.41 kg/m  03/24/17 43.03 kg/m  02/17/17 46.10 kg/m     Skin cancer: nothing worrisome Lung cancer:  Nonsmoker; quit 15+ years ago  (17) Breast cancer: no new lumps; mammo Aug 2019 Colorectal cancer: UTD; brother passed at age 73; next due March 2022; no blood in the stool Cervical cancer screening: UTD, negative, reviewed BRCA gene screening: family hx of breast and/or ovarian cancer and/or metastatic prostate cancer?  HIV, hep B, hep C: not desired STD testing and prevention (chl/gon/syphilis): not desired Intimate partner violence: no abuse Contraception: n/a Osteoporosis: n/a, start at age 65 Fall prevention/vitamin D: discussed; taking vit D 500 or so; don't take more than 1000 iu daily Immunizations: does not want flu shots Diet: pretty good eater; cautioned about red meat Exercise: walking a lot Alcohol:    Office Visit from 04/29/2018 in [facility]  AUDIT-C Score  3    not over 7 Tobacco use: quit 15+ years AAA: n/a Aspirin:taking baby aspirin because of father's hx The 10-year ASCVD risk score Mikey Bussing DC Jr., et al., 2013) is: 3.8%   Values used to calculate the score:     Age: 62 years     Sex: Female     Is Non-Hispanic African American: Yes     Diabetic:  No     Tobacco smoker: No     Systolic Blood Pressure: 161 mmHg     Is BP treated: Yes     HDL Cholesterol: 51 mg/dL     Total Cholesterol: 172 mg/dL  Glucose:  Glucose, Bld  Date Value Ref Range Status  03/10/2017 105 (H) 65 - 99 mg/dL Final    Comment:    .            Fasting reference interval . For someone without known diabetes, a glucose value between 100 and 125 mg/dL is consistent with prediabetes and should be confirmed with a follow-up test. .   12/18/2015 93 65 - 99 mg/dL Final  11/13/2015 88 65 - 99 mg/dL Final   Lipids:  Lab Results  Component Value Date   CHOL 172 03/10/2017   CHOL 181 12/18/2015   CHOL 161 05/04/2015   Lab Results  Component Value Date   HDL 51 03/10/2017   HDL 44 (L) 12/18/2015   HDL 53 05/04/2015   Lab Results  Component Value Date   LDLCALC 104 (H) 03/10/2017    LDLCALC 106 12/18/2015   LDLCALC 95 05/04/2015   Lab Results  Component Value Date   TRIG 78 03/10/2017   TRIG 156 (H) 12/18/2015   TRIG 65 05/04/2015   Lab Results  Component Value Date   CHOLHDL 3.4 03/10/2017   CHOLHDL 4.1 12/18/2015   CHOLHDL 3.0 05/04/2015   No results found for: LDLDIRECT    Depression screen [facility] 2/9 04/29/2018 03/24/2017 02/17/2017 11/13/2015 04/28/2015  Decreased Interest 0 0 0 0 0  Down, Depressed, Hopeless 0 0 0 0 0  PHQ - 2 Score 0 0 0 0 0  Altered sleeping 0 - - - -  Tired, decreased energy 0 - - - -  Change in appetite 0 - - - -  Feeling bad or failure about yourself  0 - - - -  Trouble concentrating 0 - - - -  Moving slowly or fidgety/restless 0 - - - -  Suicidal thoughts 0 - - - -  PHQ-9 Score 0 - - - -  Difficult doing work/chores Not difficult at all - - - -   Fall Risk  04/29/2018 03/24/2017 02/17/2017 11/13/2015 04/28/2015  Falls in the past year? 0 No No No No  Number falls in past yr: 0 - - - -  Injury with Fall? 0 - - - -    Relevant past medical, surgical, family and social history reviewed Past Medical History:  Diagnosis Date  . GERD (gastroesophageal reflux disease)    occasional   . Hypertension   . Obesity   . Prediabetes 11/13/2015  . Vertigo    noo episodes in several yrs  . Wears contact lenses    sometimes   Past Surgical History:  Procedure Laterality Date  . CHOLECYSTECTOMY    . COLONOSCOPY WITH PROPOFOL N/A 06/16/2015   Procedure: COLONOSCOPY WITH PROPOFOL;  Surgeon: Lucilla Lame, MD;  Location: [place];  Service: Endoscopy;  Laterality: N/A;  . POLYPECTOMY  06/16/2015   Procedure: POLYPECTOMY INTESTINAL;  Surgeon: Lucilla Lame, MD;  Location: [place];  Service: Endoscopy;;  Sigmoid colon polyp   Family History  Problem Relation Age of Onset  . Kidney disease Mother   . Hypertension Mother   . Cancer Sister        breast  . Breast cancer Sister 56  . Cancer Brother  colon  . Kidney  disease Brother   . Hypertension Father   . Cerebral palsy Brother   . Colon cancer Brother   . HIV/AIDS Brother    Social History   Tobacco Use  . Smoking status: Former Smoker    Last attempt to quit: 04/27/2005    Years since quitting: 13.0  . Smokeless tobacco: Never Used  Substance Use Topics  . Alcohol use: Yes    Alcohol/week: 2.0 standard drinks    Types: 2 Shots of liquor per week  . Drug use: Yes    Types: Marijuana    Comment: 1x/mo - last time 06/09/15     [facility] Visit from 04/29/2018 in [facility]  AUDIT-C Score  3      Interim medical history since last visit reviewed. Allergies and medications reviewed  Review of Systems  Constitutional: Negative for unexpected weight change.  HENT: Negative for nosebleeds.   Eyes: Negative for visual disturbance.       Glasses and contacts, sees eye doctor regularly  Respiratory: Negative for wheezing.   Cardiovascular: Negative for chest pain.  Gastrointestinal: Negative for blood in stool.  Endocrine: Negative for polydipsia.  Genitourinary: Negative for hematuria.  Musculoskeletal:       Heel pain B  Skin:       No worrisome moles  Neurological: Negative for tremors.  Hematological: Negative for adenopathy. Does not bruise/bleed easily.   Per HPI unless specifically indicated above     Objective:    BP 116/78   Pulse 68   Temp 97.7 F (36.5 C) (Oral)   Ht 5' 5.5" (1.664 m)   Wt 277 lb 1.6 oz (125.7 kg)   SpO2 98%   BMI 45.41 kg/m   Wt Readings from Last 3 Encounters:  04/29/18 277 lb 1.6 oz (125.7 kg)  03/24/17 266 lb 12.8 oz (121 kg)  02/17/17 277 lb (125.6 kg)    Physical Exam Constitutional:      Appearance: Normal appearance. She is well-developed. She is obese.  HENT:     Head: Normocephalic and atraumatic.     Right Ear: Hearing, tympanic membrane, ear canal and external ear normal.     Left Ear: Hearing, tympanic membrane, ear canal and external ear normal.  Eyes:      General: No scleral icterus.       Right eye: No hordeolum.        Left eye: No hordeolum.     Conjunctiva/sclera: Conjunctivae normal.  Neck:     Thyroid: No thyromegaly.     Vascular: No carotid bruit.  Cardiovascular:     Rate and Rhythm: Normal rate and regular rhythm.  No extrasystoles are present.    Pulses:          Dorsalis pedis pulses are 1+ on the right side and 1+ on the left side.     Heart sounds: Normal heart sounds, S1 normal and S2 normal.  Pulmonary:     Effort: Pulmonary effort is normal. No respiratory distress.     Breath sounds: Normal breath sounds.  Chest:     Breasts: Breasts are symmetrical.        Right: No inverted nipple, mass, nipple discharge, skin change or tenderness.        Left: No inverted nipple, mass, nipple discharge, skin change or tenderness.  Abdominal:     General: Bowel sounds are normal. There is no distension or abdominal bruit.     Palpations: Abdomen  is soft. There is no mass or pulsatile mass.     Tenderness: There is no abdominal tenderness.     Hernia: No hernia is present.  Musculoskeletal: Normal range of motion.     Right foot: Tenderness present. No deformity or bunion.     Left foot: Tenderness present. No deformity or bunion.       Feet:  Feet:     Right foot:     Skin integrity: No erythema or callus.     Left foot:     Skin integrity: No erythema or callus.  Lymphadenopathy:     Head:     Right side of head: No submandibular adenopathy.     Left side of head: No submandibular adenopathy.     Cervical: No cervical adenopathy.  Skin:    General: Skin is warm and dry.     Coloration: Skin is not pale.     Findings: No bruising or ecchymosis.  Neurological:     Mental Status: She is alert.     Cranial Nerves: No cranial nerve deficit.     Motor: No tremor or abnormal muscle tone.     Gait: Gait normal.     Deep Tendon Reflexes:     Reflex Scores:      Patellar reflexes are 2+ on the right side and 2+ on the left  side. Psychiatric:        Mood and Affect: Mood is not anxious or depressed.        Speech: Speech normal.        Behavior: Behavior normal.        Thought Content: Thought content normal.     Results for orders placed or performed in visit on 04/29/18  CBC  Result Value Ref Range   WBC 4.1 3.8 - 10.8 Thousand/uL   RBC 4.44 3.80 - 5.10 Million/uL   Hemoglobin 13.3 11.7 - 15.5 g/dL   HCT 38.8 35.0 - 45.0 %   MCV 87.4 80.0 - 100.0 fL   MCH 30.0 27.0 - 33.0 pg   MCHC 34.3 32.0 - 36.0 g/dL   RDW 12.4 11.0 - 15.0 %   Platelets 290 140 - 400 Thousand/uL   MPV 9.7 7.5 - 12.5 fL      Assessment & Plan:   Problem List Items Addressed This Visit      Cardiovascular and Mediastinum   Essential hypertension, benign (Chronic)    Refilled her medicine; check -lytes, Cr      Relevant Medications   hydrochlorothiazide (HYDRODIURIL) 25 MG tablet     Other   Right knee pain   Relevant Medications   meloxicam (MOBIC) 15 MG tablet   Preventative health care - Primary    USPSTF grade A and B recommendations reviewed with patient; age-appropriate recommendations, preventive care, screening tests, etc discussed and encouraged; healthy living encouraged; see AVS for patient education given to patient       Relevant Orders   CBC (Completed)   COMPLETE METABOLIC PANEL WITH GFR   Lipid panel   TSH   Prediabetes    Check A1c; weight loss encouraged      Obesity, Class III, BMI 40-49.9 (morbid obesity) ([place])    Encouraged weight loss      Family history of colon cancer    Cautioned about red meat and link to colon cancer; regular colonoscopies recommended; she is UTD per our chart       Other Visit Diagnoses  Bilateral foot pain       refer to podiatry   Relevant Orders   Ambulatory referral to Podiatry   Hypertension goal BP (blood pressure) < 140/90       Relevant Medications   hydrochlorothiazide (HYDRODIURIL) 25 MG tablet   Hyperglycemia       check glucose, A1c    Relevant Orders   Hemoglobin A1c       Follow up plan: Return in about 1 year (around 04/30/2019) for complete physical.  An after-visit summary was printed and given to the patient at [place].  Please see the patient instructions which may contain other information and recommendations beyond what is mentioned above in the assessment and plan.  Meds ordered this encounter  Medications  . meloxicam (MOBIC) 15 MG tablet    Sig: TAKE 1 TABLET BY MOUTH ONCE DAILY AS NEEDED FOR PAIN    Dispense:  90 tablet    Refill:  1    Please consider 90 day supplies to promote better adherence  . hydrochlorothiazide (HYDRODIURIL) 25 MG tablet    Sig: Take 1 tablet (25 mg total) by mouth daily.    Dispense:  90 tablet    Refill:  3    Orders Placed This Encounter  Procedures  . CBC  . COMPLETE METABOLIC PANEL WITH GFR  . Hemoglobin A1c  . Lipid panel  . TSH  . Ambulatory referral to Podiatry

## 2018-04-29 NOTE — Assessment & Plan Note (Signed)
Refilled her medicine; check -lytes, Cr

## 2018-04-29 NOTE — Assessment & Plan Note (Signed)
USPSTF grade A and B recommendations reviewed with patient; age-appropriate recommendations, preventive care, screening tests, etc discussed and encouraged; healthy living encouraged; see AVS for patient education given to patient  

## 2018-04-29 NOTE — Assessment & Plan Note (Signed)
Encouraged weight loss 

## 2018-04-29 NOTE — Assessment & Plan Note (Signed)
Cautioned about red meat and link to colon cancer; regular colonoscopies recommended; she is UTD per our chart

## 2018-04-29 NOTE — Progress Notes (Signed)
Amber Murray, let patient know: Your complete blood count is normal. You have normal numbers of white blood cells, red blood cells, and platelets. The other labs are pending.

## 2018-04-29 NOTE — Patient Instructions (Addendum)
We'll have you see the foot specialist  Try to follow the DASH guidelines (DASH stands for Dietary Approaches to Stop Hypertension). Try to limit the sodium in your diet to no more than 1,'500mg'$  of sodium per day. Certainly try to not exceed 2,000 mg per day at the very most. Do not add salt when cooking or at the table.  Check the sodium amount on labels when shopping, and choose items lower in sodium when given a choice. Avoid or limit foods that already contain a lot of sodium. Eat a diet rich in fruits and vegetables and whole grains, and try to lose weight if overweight or obese  We'll stop the amlodipine for now Monitor your blood pressure and let me know if your top number is trending up; I'd like it under 130 ideally and at least under 140 Call me with your readings in 2-3 weeks  I do recommend yearly flu shots; for individuals who don't want flu shots, try to practice excellent hand hygiene, and avoid nursing homes, day cares, and hospitals during peak flu season; taking additional vitamin C daily during flu/cold season may help boost your immune system too  Check out the information at familydoctor.org entitled "Nutrition for Weight Loss: What You Need to Know about Fad Diets" Try to lose between 1-2 pounds per week by taking in fewer calories and burning off more calories You can succeed by limiting portions, limiting foods dense in calories and fat, becoming more active, and drinking 8 glasses of water a day (64 ounces) Don't skip meals, especially breakfast, as skipping meals may alter your metabolism Do not use over-the-counter weight loss pills or gimmicks that claim rapid weight loss A healthy BMI (or body mass index) is between 18.5 and 24.9 You can calculate your ideal BMI at the [place] website ClubMonetize.fr  Obesity, Adult Obesity is the condition of having too much total body fat. Being overweight or obese means that your weight  is greater than what is considered healthy for your body size. Obesity is determined by a measurement called BMI. BMI is an estimate of body fat and is calculated from height and weight. For adults, a BMI of 30 or higher is considered obese. Obesity can eventually lead to other health concerns and major illnesses, including:  Stroke.  Coronary artery disease (CAD).  Type 2 diabetes.  Some types of cancer, including cancers of the colon, breast, uterus, and gallbladder.  Osteoarthritis.  High blood pressure (hypertension).  High cholesterol.  Sleep apnea.  Gallbladder stones.  Infertility problems. What are the causes? The main cause of obesity is taking in (consuming) more calories than your body uses for energy. Other factors that contribute to this condition may include:  Being born with genes that make you more likely to become obese.  Having a medical condition that causes obesity. These conditions include: ? Hypothyroidism. ? Polycystic ovarian syndrome (PCOS). ? Binge-eating disorder. ? Cushing syndrome.  Taking certain medicines, such as steroids, antidepressants, and seizure medicines.  Not being physically active (sedentary lifestyle).  Living where there are limited places to exercise safely or buy healthy foods.  Not getting enough sleep. What increases the risk? The following factors may increase your risk of this condition:  Having a family history of obesity.  Being a woman of African-American descent.  Being a man of Hispanic descent. What are the signs or symptoms? Having excessive body fat is the main symptom of this condition. How is this diagnosed? This condition may be  diagnosed based on:  Your symptoms.  Your medical history.  A physical exam. Your health care provider may measure: ? Your BMI. If you are an adult with a BMI between 25 and less than 30, you are considered overweight. If you are an adult with a BMI of 30 or higher, you are  considered obese. ? The distances around your hips and your waist (circumferences). These may be compared to each other to help diagnose your condition. ? Your skinfold thickness. Your health care provider may gently pinch a fold of your skin and measure it. How is this treated? Treatment for this condition often includes changing your lifestyle. Treatment may include some or all of the following:  Dietary changes. Work with your health care provider and a dietitian to set a weight-loss goal that is healthy and reasonable for you. Dietary changes may include eating: ? Smaller portions. A portion size is the amount of a particular food that is healthy for you to eat at one time. This varies from person to person. ? Low-calorie or low-fat options. ? More whole grains, fruits, and vegetables.  Regular physical activity. This may include aerobic activity (cardio) and strength training.  Medicine to help you lose weight. Your health care provider may prescribe medicine if you are unable to lose 1 pound a week after 6 weeks of eating more healthily and doing more physical activity.  Surgery. Surgical options may include gastric banding and gastric bypass. Surgery may be done if: ? Other treatments have not helped to improve your condition. ? You have a BMI of 40 or higher. ? You have life-threatening health problems related to obesity. Follow these instructions at home:  Eating and drinking   Follow recommendations from your health care provider about what you eat and drink. Your health care provider may advise you to: ? Limit fast foods, sweets, and processed snack foods. ? Choose low-fat options, such as low-fat milk instead of whole milk. ? Eat 5 or more servings of fruits or vegetables every day. ? Eat at home more often. This gives you more control over what you eat. ? Choose healthy foods when you eat out. ? Learn what a healthy portion size is. ? Keep low-fat snacks on hand. ? Avoid  sugary drinks, such as soda, fruit juice, iced tea sweetened with sugar, and flavored milk. ? Eat a healthy breakfast.  Drink enough water to keep your urine clear or pale yellow.  Do not go without eating for long periods of time (do not fast) or follow a fad diet. Fasting and fad diets can be unhealthy and even dangerous. Physical Activity  Exercise regularly, as told by your health care provider. Ask your health care provider what types of exercise are safe for you and how often you should exercise.  Warm up and stretch before being active.  Cool down and stretch after being active.  Rest between periods of activity. Lifestyle  Limit the time that you spend in front of your TV, computer, or video game system.  Find ways to reward yourself that do not involve food.  Limit alcohol intake to no more than 1 drink a day for nonpregnant women and 2 drinks a day for men. One drink equals 12 oz of beer, 5 oz of wine, or 1 oz of hard liquor. General instructions  Keep a weight loss journal to keep track of the food you eat and how much you exercise you get.  Take over-the-counter and prescription medicines  only as told by your health care provider.  Take vitamins and supplements only as told by your health care provider.  Consider joining a support group. Your health care provider may be able to recommend a support group.  Keep all follow-up visits as told by your health care provider. This is important. Contact a health care provider if:  You are unable to meet your weight loss goal after 6 weeks of dietary and lifestyle changes. This information is not intended to replace advice given to you by your health care provider. Make sure you discuss any questions you have with your health care provider. Document Released: 05/02/2004 Document Revised: 08/28/2015 Document Reviewed: 01/11/2015 Elsevier Interactive Patient Education  2019 Else[employer] Inc.  Preventing Unhealthy [employer],  Adult Staying at a healthy weight is important to your overall health. When fat builds up in your body, you may become overweight or obese. Being overweight or obese increases your risk of developing certain health problems, such as heart disease, diabetes, sleeping problems, joint problems, and some types of cancer. Unhealthy weight gain is often the result of making unhealthy food choices or not getting enough exercise. You can make changes to your lifestyle to prevent obesity and stay as healthy as possible. What nutrition changes can be made?   Eat only as much as your body needs. To do this: ? Pay attention to signs that you are hungry or full. Stop eating as soon as you feel full. ? If you feel hungry, try drinking water first before eating. Drink enough water so your urine is clear or pale yellow. ? Eat smaller portions. Pay attention to portion sizes when eating out. ? Look at serving sizes on food labels. Most foods contain more than one serving per container. ? Eat the recommended number of calories for your gender and activity level. For most active people, a daily total of 2,000 calories is appropriate. If you are trying to lose weight or are not very active, you may need to eat fewer calories. Talk with your health care provider or a diet and nutrition specialist (dietitian) about how many calories you need each day.  Choose healthy foods, such as: ? Fruits and vegetables. At each meal, try to fill at least half of your plate with fruits and vegetables. ? Whole grains, such as whole-wheat bread, brown rice, and quinoa. ? Lean meats, such as chicken or fish. ? Other healthy proteins, such as beans, eggs, or tofu. ? Healthy fats, such as nuts, seeds, fatty fish, and olive oil. ? Low-fat or fat-free dairy products.  Check food labels, and avoid food and drinks that: ? Are high in calories. ? Have added sugar. ? Are high in sodium. ? Have saturated fats or trans fats.  Cook foods  in healthier ways, such as by baking, broiling, or grilling.  Make a meal plan for the week, and shop with a grocery list to help you stay on track with your purchases. Try to avoid going to the grocery store when you are hungry.  When grocery shopping, try to shop around the outside of the store first, where the fresh foods are. Doing this helps you to avoid prepackaged foods, which can be high in sugar, salt (sodium), and fat. What lifestyle changes can be made?   Exercise for 30 or more minutes on 5 or more days each week. Exercising may include brisk walking, yard work, biking, running, swimming, and team sports like basketball and soccer. Ask your health care  provider which exercises are safe for you.  Do muscle-strengthening activities, such as lifting weights or using resistance bands, on 2 or more days a week.  Do not use any products that contain nicotine or tobacco, such as cigarettes and e-cigarettes. If you need help quitting, ask your health care provider.  Limit alcohol intake to no more than 1 drink a day for nonpregnant women and 2 drinks a day for men. One drink equals 12 oz of beer, 5 oz of wine, or 1 oz of hard liquor.  Try to get 7-9 hours of sleep each night. What other changes can be made?  Keep a food and activity journal to keep track of: ? What you ate and how many calories you had. Remember to count the calories in sauces, dressings, and side dishes. ? Whether you were active, and what exercises you did. ? Your calorie, weight, and activity goals.  Check your weight regularly. Track any changes. If you notice you have gained weight, make changes to your diet or activity routine.  Avoid taking weight-loss medicines or supplements. Talk to your health care provider before starting any new medicine or supplement.  Talk to your health care provider before trying any new diet or exercise plan. Why are these changes important? Eating healthy, staying active, and  having healthy habits can help you to prevent obesity. Those changes also:  Help you manage stress and emotions.  Help you connect with friends and family.  Improve your self-esteem.  Improve your sleep.  Prevent long-term health problems. What can happen if changes are not made? Being obese or overweight can cause you to develop joint or bone problems, which can make it hard for you to stay active or do activities you enjoy. Being obese or overweight also puts stress on your heart and lungs and can lead to health problems like diabetes, heart disease, and some cancers. Where to find more information Talk with your health care provider or a dietitian about healthy eating and healthy lifestyle choices. You may also find information from:  [place]. Department of Agriculture, MyPlate: FormerBoss.no  American Heart Association: www.heart.org  Centers for Disease Control and Prevention: http://www.wolf.info/ Summary  Staying at a healthy weight is important to your overall health. It helps you to prevent certain diseases and health problems, such as heart disease, diabetes, joint problems, sleep disorders, and some types of cancer.  Being obese or overweight can cause you to develop joint or bone problems, which can make it hard for you to stay active or do activities you enjoy.  You can prevent unhealthy weight gain by eating a healthy diet, exercising regularly, not smoking, limiting alcohol, and getting enough sleep.  Talk with your health care provider or a dietitian for guidance about healthy eating and healthy lifestyle choices. This information is not intended to replace advice given to you by your health care provider. Make sure you discuss any questions you have with your health care provider. Document Released: 03/26/2016 Document Revised: 01/03/2017 Document Reviewed: 05/01/2016 Elsevier Interactive Patient Education  2019 [place] Maintenance, Female Adopting a healthy  lifestyle and getting preventive care can go a long way to promote health and wellness. Talk with your health care provider about what schedule of regular examinations is right for you. This is a good chance for you to check in with your provider about disease prevention and staying healthy. In between checkups, there are plenty of things you can do on your own. Experts have done  a lot of research about which lifestyle changes and preventive measures are most likely to keep you healthy. Ask your health care provider for more information. Weight and diet Eat a healthy diet  Be sure to include plenty of vegetables, fruits, low-fat dairy products, and lean protein.  Do not eat a lot of foods high in solid fats, added sugars, or salt.  Get regular exercise. This is one of the most important things you can do for your health. ? Most adults should exercise for at least 150 minutes each week. The exercise should increase your heart rate and make you sweat (moderate-intensity exercise). ? Most adults should also do strengthening exercises at least twice a week. This is in addition to the moderate-intensity exercise. Maintain a healthy weight  Body mass index (BMI) is a measurement that can be used to identify possible weight problems. It estimates body fat based on height and weight. Your health care provider can help determine your BMI and help you achieve or maintain a healthy weight.  For females 67 years of age and older: ? A BMI below 18.5 is considered underweight. ? A BMI of 18.5 to 24.9 is normal. ? A BMI of 25 to 29.9 is considered overweight. ? A BMI of 30 and above is considered obese. Watch levels of cholesterol and blood lipids  You should start having your blood tested for lipids and cholesterol at 58 years of age, then have this test every 5 years.  You may need to have your cholesterol levels checked more often if: ? Your lipid or cholesterol levels are high. ? You are older than  58 years of age. ? You are at high risk for heart disease. Cancer screening Lung Cancer  Lung cancer screening is recommended for adults 63-65 years old who are at high risk for lung cancer because of a history of smoking.  A yearly low-dose CT scan of the lungs is recommended for people who: ? Currently smoke. ? Have quit within the past 15 years. ? Have at least a 30-pack-year history of smoking. A pack year is smoking an average of one pack of cigarettes a day for 1 year.  Yearly screening should continue until it has been 15 years since you quit.  Yearly screening should stop if you develop a health problem that would prevent you from having lung cancer treatment. Breast Cancer  Practice breast self-awareness. This means understanding how your breasts normally appear and feel.  It also means doing regular breast self-exams. Let your health care provider know about any changes, no matter how small.  If you are in your 20s or 30s, you should have a clinical breast exam (CBE) by a health care provider every 1-3 years as part of a regular health exam.  If you are 36 or older, have a CBE every year. Also consider having a breast X-ray (mammogram) every year.  If you have a family history of breast cancer, talk to your health care provider about genetic screening.  If you are at high risk for breast cancer, talk to your health care provider about having an MRI and a mammogram every year.  Breast cancer gene (BRCA) assessment is recommended for women who have family members with BRCA-related cancers. BRCA-related cancers include: ? Breast. ? Ovarian. ? Tubal. ? Peritoneal cancers.  Results of the assessment will determine the need for genetic counseling and BRCA1 and BRCA2 testing. Cervical Cancer Your health care provider may recommend that you be screened regularly  for cancer of the pelvic organs (ovaries, uterus, and vagina). This screening involves a pelvic examination,  including checking for microscopic changes to the surface of your cervix (Pap test). You may be encouraged to have this screening done every 3 years, beginning at age 1.  For women ages 10-65, health care providers may recommend pelvic exams and Pap testing every 3 years, or they may recommend the Pap and pelvic exam, combined with testing for human papilloma virus (HPV), every 5 years. Some types of HPV increase your risk of cervical cancer. Testing for HPV may also be done on women of any age with unclear Pap test results.  Other health care providers may not recommend any screening for nonpregnant women who are considered low risk for pelvic cancer and who do not have symptoms. Ask your health care provider if a screening pelvic exam is right for you.  If you have had past treatment for cervical cancer or a condition that could lead to cancer, you need Pap tests and screening for cancer for at least 20 years after your treatment. If Pap tests have been discontinued, your risk factors (such as having a new sexual partner) need to be reassessed to determine if screening should resume. Some women have medical problems that increase the chance of getting cervical cancer. In these cases, your health care provider may recommend more frequent screening and Pap tests. Colorectal Cancer  This type of cancer can be detected and often prevented.  Routine colorectal cancer screening usually begins at 58 years of age and continues through 58 years of age.  Your health care provider may recommend screening at an earlier age if you have risk factors for colon cancer.  Your health care provider may also recommend using home test kits to check for hidden blood in the stool.  A small camera at the end of a tube can be used to examine your colon directly (sigmoidoscopy or colonoscopy). This is done to check for the earliest forms of colorectal cancer.  Routine screening usually begins at age 46.  Direct  examination of the colon should be repeated every 5-10 years through 58 years of age. However, you may need to be screened more often if early forms of precancerous polyps or small growths are found. Skin Cancer  Check your skin from head to toe regularly.  Tell your health care provider about any new moles or changes in moles, especially if there is a change in a mole's shape or color.  Also tell your health care provider if you have a mole that is larger than the size of a pencil eraser.  Always use sunscreen. Apply sunscreen liberally and repeatedly throughout the day.  Protect yourself by wearing long sleeves, pants, a wide-brimmed hat, and sunglasses whenever you are outside. Heart disease, diabetes, and high blood pressure  High blood pressure causes heart disease and increases the risk of stroke. High blood pressure is more likely to develop in: ? People who have blood pressure in the high end of the normal range (130-139/85-89 mm Hg). ? People who are overweight or obese. ? People who are African American.  If you are 69-69 years of age, have your blood pressure checked every 3-5 years. If you are 18 years of age or older, have your blood pressure checked every year. You should have your blood pressure measured twice-once when you are at a hospital or clinic, and once when you are not at a hospital or clinic. Record the average  of the two measurements. To check your blood pressure when you are not at a hospital or clinic, you can use: ? An automated blood pressure machine at a pharmacy. ? A home blood pressure monitor.  If you are between 37 years and 58 years old, ask your health care provider if you should take aspirin to prevent strokes.  Have regular diabetes screenings. This involves taking a blood sample to check your fasting blood sugar level. ? If you are at a normal weight and have a low risk for diabetes, have this test once every three years after 58 years of age. ? If  you are overweight and have a high risk for diabetes, consider being tested at a younger age or more often. Preventing infection Hepatitis B  If you have a higher risk for hepatitis B, you should be screened for this virus. You are considered at high risk for hepatitis B if: ? You were born in a country where hepatitis B is common. Ask your health care provider which countries are considered high risk. ? Your parents were born in a high-risk country, and you have not been immunized against hepatitis B (hepatitis B vaccine). ? You have HIV or AIDS. ? You use needles to inject street drugs. ? You live with someone who has hepatitis B. ? You have had sex with someone who has hepatitis B. ? You get hemodialysis treatment. ? You take certain medicines for conditions, including cancer, organ transplantation, and autoimmune conditions. Hepatitis C  Blood testing is recommended for: ? Everyone born from [birth year] through 1965. ? Anyone with known risk factors for hepatitis C. Sexually transmitted infections (STIs)  You should be screened for sexually transmitted infections (STIs) including gonorrhea and chlamydia if: ? You are sexually active and are younger than 58 years of age. ? You are older than 58 years of age and your health care provider tells you that you are at risk for this type of infection. ? Your sexual activity has changed since you were last screened and you are at an increased risk for chlamydia or gonorrhea. Ask your health care provider if you are at risk.  If you do not have HIV, but are at risk, it may be recommended that you take a prescription medicine daily to prevent HIV infection. This is called pre-exposure prophylaxis (PrEP). You are considered at risk if: ? You are sexually active and do not regularly use condoms or know the HIV status of your partner(s). ? You take drugs by injection. ? You are sexually active with a partner who has HIV. Talk with your health care  provider about whether you are at high risk of being infected with HIV. If you choose to begin PrEP, you should first be tested for HIV. You should then be tested every 3 months for as long as you are taking PrEP. Pregnancy  If you are premenopausal and you may become pregnant, ask your health care provider about preconception counseling.  If you may become pregnant, take 400 to 800 micrograms (mcg) of folic acid every day.  If you want to prevent pregnancy, talk to your health care provider about birth control (contraception). Osteoporosis and menopause  Osteoporosis is a disease in which the bones lose minerals and strength with aging. This can result in serious bone fractures. Your risk for osteoporosis can be identified using a bone density scan.  If you are 57 years of age or older, or if you are at risk for osteoporosis  and fractures, ask your health care provider if you should be screened.  Ask your health care provider whether you should take a calcium or vitamin D supplement to lower your risk for osteoporosis.  Menopause may have certain physical symptoms and risks.  Hormone replacement therapy may reduce some of these symptoms and risks. Talk to your health care provider about whether hormone replacement therapy is right for you. Follow these instructions at home:  Schedule regular health, dental, and eye exams.  Stay current with your immunizations.  Do not use any tobacco products including cigarettes, chewing tobacco, or electronic cigarettes.  If you are pregnant, do not drink alcohol.  If you are breastfeeding, limit how much and how often you drink alcohol.  Limit alcohol intake to no more than 1 drink per day for nonpregnant women. One drink equals 12 ounces of beer, 5 ounces of wine, or 1 ounces of hard liquor.  Do not use street drugs.  Do not share needles.  Ask your health care provider for help if you need support or information about quitting  drugs.  Tell your health care provider if you often feel depressed.  Tell your health care provider if you have ever been abused or do not feel safe at home. This information is not intended to replace advice given to you by your health care provider. Make sure you discuss any questions you have with your health care provider. Document Released: 10/08/2010 Document Revised: 08/31/2015 Document Reviewed: 12/27/2014 Elsevier Interactive Patient Education  2019 [employer].

## 2018-04-30 LAB — LIPID PANEL
CHOLESTEROL: 179 mg/dL (ref <200)
HDL: 52 mg/dL (ref >50)
LDL Cholesterol (Calc): 110 mg/dL (calc) — ABNORMAL HIGH
Non-HDL Cholesterol (Calc): 127 mg/dL (calc) (ref <130)
Total CHOL/HDL Ratio: 3.4 (calc) (ref <5.0)
Triglycerides: 80 mg/dL (ref <150)

## 2018-04-30 LAB — COMPLETE METABOLIC PANEL WITH GFR
AG Ratio: 1.4 (calc) (ref 1.0–2.5)
ALT: 15 U/L (ref 6–29)
AST: 16 U/L (ref 10–35)
Albumin: 3.9 g/dL (ref 3.6–5.1)
Alkaline phosphatase (APISO): 85 U/L (ref 33–130)
BUN: 13 mg/dL (ref 7–25)
CO2: 30 mmol/L (ref 20–32)
Calcium: 9.1 mg/dL (ref 8.6–10.4)
Chloride: 104 mmol/L (ref 98–110)
Creat: 0.73 mg/dL (ref 0.50–1.05)
GFR, Est African American: 106 mL/min/{1.73_m2} (ref >=60)
GFR, Est Non African American: 91 mL/min/{1.73_m2} (ref >=60)
Globulin: 2.8 g/dL (calc) (ref 1.9–3.7)
Glucose, Bld: 91 mg/dL (ref 65–99)
Potassium: 3.8 mmol/L (ref 3.5–5.3)
Sodium: 140 mmol/L (ref 135–146)
Total Bilirubin: 0.4 mg/dL (ref 0.2–1.2)
Total Protein: 6.7 g/dL (ref 6.1–8.1)

## 2018-04-30 LAB — CBC
HEMATOCRIT: 38.8 % (ref 35.0–45.0)
Hemoglobin: 13.3 g/dL (ref 11.7–15.5)
MCH: 30 pg (ref 27.0–33.0)
MCHC: 34.3 g/dL (ref 32.0–36.0)
MCV: 87.4 fL (ref 80.0–100.0)
MPV: 9.7 fL (ref 7.5–12.5)
Platelets: 290 10*3/uL (ref 140–400)
RBC: 4.44 10*6/uL (ref 3.80–5.10)
RDW: 12.4 % (ref 11.0–15.0)
WBC: 4.1 10*3/uL (ref 3.8–10.8)

## 2018-04-30 LAB — HEMOGLOBIN A1C
Hgb A1c MFr Bld: 5.7 % of total Hgb — ABNORMAL HIGH (ref <5.7)
Mean Plasma Glucose: 117 (calc)
eAG (mmol/L): 6.5 (calc)

## 2018-04-30 LAB — TSH: TSH: 1.21 mIU/L (ref 0.40–4.50)

## 2018-04-30 NOTE — Progress Notes (Signed)
Amber Murray, let her know glucose is normal that day, but her 3 month blood sugar average (the A1c) is elevated and in the prediabetes category. The single most important thing she can do to prevent this from turning into type 2 diabetes is weight loss. Offer referral to diabetic educator for prediabetes and morbid obesity. Her LDL is up a little, so really work on weight loss, activity (build up slowly and gradually), and really limit saturated fats. I'll want to see her back in 6 months for recheck of glucose, A1c, lipids, and morbid obesity.  The 10-year ASCVD risk score Amber Murray Amber Murray., et al., 2013) is: 3.9%   Values used to calculate the score:     Age: 58 years     Sex: Female     Is Non-Hispanic African American: Yes     Diabetic: No     Tobacco smoker: No     Systolic Blood Pressure: 564 mmHg     Is BP treated: Yes     HDL Cholesterol: 52 mg/dL     Total Cholesterol: 179 mg/dL

## 2018-05-20 ENCOUNTER — Encounter: Payer: Self-pay | Admitting: Podiatry

## 2018-05-20 ENCOUNTER — Ambulatory Visit (INDEPENDENT_AMBULATORY_CARE_PROVIDER_SITE_OTHER): Payer: 59

## 2018-05-20 ENCOUNTER — Ambulatory Visit: Payer: 59 | Admitting: Podiatry

## 2018-05-20 VITALS — BP 117/72 | HR 62 | Resp 16

## 2018-05-20 DIAGNOSIS — M722 Plantar fascial fibromatosis: Secondary | ICD-10-CM | POA: Diagnosis not present

## 2018-05-20 MED ORDER — MELOXICAM 15 MG PO TABS: 15.0000 mg | ORAL_TABLET | Freq: Every day | ORAL | 3 refills | Status: DC

## 2018-05-20 MED ORDER — METHYLPREDNISOLONE 4 MG PO TBPK: ORAL_TABLET | ORAL | 0 refills | Status: DC

## 2018-05-20 NOTE — Patient Instructions (Signed)

## 2018-05-20 NOTE — Progress Notes (Signed)
Subjective:  Patient ID: Amber Murray, female    DOB: Aug 27, 1960,  MRN: [id number] HPI Chief Complaint  Patient presents with  . Foot Pain    Plantar heel bilateral - aching x 3 months, AM pain, tried pain meds (referring to meloxicam), and pain rub-no help  . New Patient (Initial Visit)    58 y.o. female presents with the above complaint.   ROS: Denies fever chills nausea vomiting muscle aches pains calf pain back pain chest pain shortness of breath.  Past Medical History:  Diagnosis Date  . GERD (gastroesophageal reflux disease)    occasional   . Hypertension   . Obesity   . Prediabetes 11/13/2015  . Vertigo    noo episodes in several yrs  . Wears contact lenses    sometimes   Past Surgical History:  Procedure Laterality Date  . CHOLECYSTECTOMY    . COLONOSCOPY WITH PROPOFOL N/A 06/16/2015   Procedure: COLONOSCOPY WITH PROPOFOL;  Surgeon: Lucilla Lame, MD;  Location: [place];  Service: Endoscopy;  Laterality: N/A;  . POLYPECTOMY  06/16/2015   Procedure: POLYPECTOMY INTESTINAL;  Surgeon: Lucilla Lame, MD;  Location: [place];  Service: Endoscopy;;  Sigmoid colon polyp    Current Outpatient Medications:  .  aspirin EC 81 MG tablet, Take 81 mg by mouth daily., Disp: , Rfl:  .  Cholecalciferol (VITAMIN D3 PO), Take by mouth., Disp: , Rfl:  .  Cyanocobalamin (VITAMIN B-12 PO), Take 1,000 mcg by mouth 2 (two) times daily., Disp: , Rfl:  .  hydrochlorothiazide (HYDRODIURIL) 25 MG tablet, Take 1 tablet (25 mg total) by mouth daily., Disp: 90 tablet, Rfl: 3 .  meloxicam (MOBIC) 15 MG tablet, Take 1 tablet (15 mg total) by mouth daily., Disp: 30 tablet, Rfl: 3 .  methylPREDNISolone (MEDROL DOSEPAK) 4 MG TBPK tablet, 6 day dose pack - take as directed, Disp: 21 tablet, Rfl: 0  No Known Allergies Review of Systems Objective:   Vitals:   05/20/18 1601  BP: 117/72  Pulse: 62  Resp: 16    General: Well developed, nourished, in no acute distress,  alert and oriented x3   Dermatological: Skin is warm, dry and supple bilateral. Nails x 10 are well maintained; remaining integument appears unremarkable at this time. There are no open sores, no preulcerative lesions, no rash or signs of infection present.  Vascular: Dorsalis Pedis artery and Posterior Tibial artery pedal pulses are 2/4 bilateral with immedate capillary fill time. Pedal hair growth present. No varicosities and no lower extremity edema present bilateral.   Neruologic: Grossly intact via light touch bilateral. Vibratory intact via tuning fork bilateral. Protective threshold with Semmes Wienstein monofilament intact to all pedal sites bilateral. Patellar and Achilles deep tendon reflexes 2+ bilateral. No Babinski or clonus noted bilateral.   Musculoskeletal: No gross boney pedal deformities bilateral. No pain, crepitus, or limitation noted with foot and ankle range of motion bilateral. Muscular strength 5/5 in all groups tested bilateral.  She has pain on palpation medial calcaneal tubercle bilateral.  Flexible pes planus bilateral.  Gait: Unassisted, Nonantalgic.    Radiographs:  Radiographs taken today demonstrate pes planus bilateral soft tissue increase in density plantar calcaneal insertion site.  Assessment & Plan:   Assessment: Plantar fasciitis bilateral.  Plan: Discussed etiology pathology and surgical therapies.  Start her on a Medrol Dosepak to be followed by meloxicam.  After sterile Betadine skin prep I injected 20 mg Kenalog 5 mg Marcaine point maximal tenderness bilaterally.  Tolerated procedure  well.  Put her in plantar pressure braces bilateral and a single night splint.  Discussed appropriate shoe gear stretching exercise ice therapy and shoe gear modifications.  Follow-up with her in 1 month.  May need to consider orthotics.     Juwon Scripter T. Chalmers, [place]

## 2018-05-29 ENCOUNTER — Telehealth: Payer: Self-pay

## 2018-05-29 NOTE — Telephone Encounter (Signed)
She meant that she just stopped the amlodipine She is still taking her HCTZ The note was note supposed to say "meds", just the one medicine She is doing well, no need to return right now I spokew ith her

## 2018-05-29 NOTE — Telephone Encounter (Signed)
Copied from [facility] [id number]. Topic: General - Other >> May 28, 2018  3:25 PM Windy Kalata wrote: Reason for CRM: patient is reporting that her BP last Friday was 117/72 since she has not been taking her BP meds  Call back is [phone]

## 2018-05-29 NOTE — Telephone Encounter (Signed)
Can you please ask her why she stopped taking her BP medicine? I don't see that we talked about having her stop that Was she having low pressures? Dizziness? Excessive urination? Just wanting some clarification If she is going to stay off of her medicine, we'll want her to come by here in 2 weeks for BP check with CMA

## 2018-06-09 ENCOUNTER — Ambulatory Visit: Payer: 59 | Admitting: Nurse Practitioner

## 2018-06-09 ENCOUNTER — Other Ambulatory Visit (HOSPITAL_COMMUNITY)
Admission: RE | Admit: 2018-06-09 | Discharge: 2018-06-09 | Disposition: A | Discharge status: Home / Self Care | Payer: 59 | Source: Ambulatory Visit | Attending: Nurse Practitioner | Admitting: Nurse Practitioner

## 2018-06-09 ENCOUNTER — Encounter: Payer: Self-pay | Admitting: Nurse Practitioner

## 2018-06-09 VITALS — BP 110/74 | HR 97 | Temp 98.2°F | Resp 16 | Ht 65.0 in | Wt 276.1 lb

## 2018-06-09 DIAGNOSIS — N898 Other specified noninflammatory disorders of vagina: Secondary | ICD-10-CM

## 2018-06-09 DIAGNOSIS — N950 Postmenopausal bleeding: Secondary | ICD-10-CM | POA: Diagnosis not present

## 2018-06-09 DIAGNOSIS — R1032 Left lower quadrant pain: Secondary | ICD-10-CM

## 2018-06-09 NOTE — Progress Notes (Signed)
Name: Amber Murray   MRN: [id number]    DOB: November 12, 1960   Date:06/09/2018       Progress Note  Subjective  Chief Complaint  Chief Complaint  Patient presents with  . Vaginal Bleeding    2 days    HPI  Patient endorses vaginal bleeding last Thursday and Friday- bright red blood; states about 2 tablespoons work. Has resolved since. Last had sex on Sunday, states no trauma precipitating event. Denies vaginal discharge, dryness or vaginal pain. Left lower quadrant pain- mild, worse when laying down- thought it was related to tight belt. States only hurts when laying in bed and turns a certain way. Denies vaginal soreness with dyspareunia and bleeding, nausea, vomiting, diarrhea, fever, unexplained weight loss, changes in bowel function. States has noticed incomplete bladder emptying- reads on the toilet when she is peeing and just pees intermittently while on the toilet. Never on hormone therapies or anticoagulants. Does not use soy-containing supplements  Brother- colon cancer at 28 Sister- breast cancer at 19.    Pap smear from 03/24/2017 Negative for intraepithelial lesion or malignancy & HPV. She is oves   Patient Active Problem List   Diagnosis Date Noted  . Preventative health care 04/29/2018  . Leg cramps 02/17/2017  . Encounter for medication monitoring 12/18/2015  . Essential hypertension, benign 11/14/2015  . Shortness of breath 11/14/2015  . Prediabetes 11/13/2015  . Medication monitoring encounter 11/13/2015  . Special screening for malignant neoplasms, colon   . Benign neoplasm of rectosigmoid junction   . Family history of breast cancer 04/28/2015  . Family history of colon cancer 04/28/2015  . Right knee pain 04/28/2015  . Obesity, Class III, BMI 40-49.9 (morbid obesity) ([place]) 04/28/2015  . Screening for STD (sexually transmitted disease) 04/28/2015  . Encounter for screening mammogram for malignant neoplasm of breast 04/28/2015  . Need for  diphtheria-tetanus-pertussis (Tdap) vaccine, adult/adolescent 04/28/2015    Past Medical History:  Diagnosis Date  . GERD (gastroesophageal reflux disease)    occasional   . Hypertension   . Obesity   . Prediabetes 11/13/2015  . Vertigo    noo episodes in several yrs  . Wears contact lenses    sometimes    Past Surgical History:  Procedure Laterality Date  . CHOLECYSTECTOMY    . COLONOSCOPY WITH PROPOFOL N/A 06/16/2015   Procedure: COLONOSCOPY WITH PROPOFOL;  Surgeon: Lucilla Lame, MD;  Location: [place];  Service: Endoscopy;  Laterality: N/A;  . POLYPECTOMY  06/16/2015   Procedure: POLYPECTOMY INTESTINAL;  Surgeon: Lucilla Lame, MD;  Location: [place];  Service: Endoscopy;;  Sigmoid colon polyp    Social History   Tobacco Use  . Smoking status: Former Smoker    Last attempt to quit: 04/27/2005    Years since quitting: 13.1  . Smokeless tobacco: Never Used  Substance Use Topics  . Alcohol use: Yes    Alcohol/week: 2.0 standard drinks    Types: 2 Shots of liquor per week     Current Outpatient Medications:  .  aspirin EC 81 MG tablet, Take 81 mg by mouth daily., Disp: , Rfl:  .  Cholecalciferol (VITAMIN D3 PO), Take by mouth., Disp: , Rfl:  .  Cyanocobalamin (VITAMIN B-12 PO), Take 1,000 mcg by mouth 2 (two) times daily., Disp: , Rfl:  .  hydrochlorothiazide (HYDRODIURIL) 25 MG tablet, Take 1 tablet (25 mg total) by mouth daily., Disp: 90 tablet, Rfl: 3 .  meloxicam (MOBIC) 15 MG tablet, Take 1 tablet (15  mg total) by mouth daily. (Patient taking differently: Take 15 mg by mouth as needed. ), Disp: 30 tablet, Rfl: 3 .  methylPREDNISolone (MEDROL DOSEPAK) 4 MG TBPK tablet, 6 day dose pack - take as directed (Patient not taking: Reported on 06/09/2018), Disp: 21 tablet, Rfl: 0  No Known Allergies  ROS   No other specific complaints in a complete review of systems (except as listed in HPI above).  Objective  Vitals:   06/09/18 1505  BP: 110/74   Pulse: 97  Resp: 16  Temp: 98.2 F (36.8 C)  TempSrc: Oral  SpO2: 99%  Weight: 276 lb 1.6 oz (125.2 kg)  Height: 5\' 5"  (1.651 m)    Body mass index is 45.95 kg/m.  Nursing Note and Vital Signs reviewed.  Physical Exam Exam conducted with a chaperone present.  Constitutional:      Appearance: Normal appearance.  HENT:     Head: Normocephalic and atraumatic.  Eyes:     Conjunctiva/sclera: Conjunctivae normal.  Cardiovascular:     Rate and Rhythm: Normal rate.     Pulses: Normal pulses.  Pulmonary:     Effort: Pulmonary effort is normal.  Abdominal:     General: Bowel sounds are normal. There is no distension.     Palpations: There is no mass.     Tenderness: There is no abdominal tenderness. There is no rebound.  Genitourinary:    General: Normal vulva.     Labia:        Right: No rash, tenderness, lesion or injury.        Left: No rash, tenderness, lesion or injury.      Vagina: No signs of injury and foreign body. Vaginal discharge present. No erythema, tenderness, bleeding or lesions.     Cervix: No cervical bleeding.     Uterus: Normal.   Skin:    General: Skin is warm.     Findings: No bruising, erythema or rash.  Neurological:     General: No focal deficit present.     Mental Status: She is alert.  Psychiatric:        Mood and Affect: Mood normal.        Behavior: Behavior normal.       No results found for this or any previous visit (from the past 48 hour(s)).  Assessment & Plan  1. Postmenopausal bleeding Will do ultrasound as patient is additionally having mild intermittent lower quadrant abdominal pain without any other associated symptoms, thought to be tight belt during the day. Likely benign, however patient has increased risk factors with family history and obesity will do imaging to rule out malignancy and assess for cysts, fibroids. Refer to GYN as needed.  - US Transvaginal Non-OB; Future  2. Vaginal discharge - Cervicovaginal ancillary  only  3. Left lower quadrant pain Stop wearing tight belts, OTC pain management as needed, monitor if unrelieved return.  - US Transvaginal Non-OB; Future   Face-to-face time with patient was more than 25 minutes, >50% time spent counseling and coordination of care

## 2018-06-09 NOTE — Patient Instructions (Addendum)
If you have not heard anything from my staff in a week about your results from today. You should also hear about your ultrasound within the month. Please contact us here to follow-up [phone]. Based of these results we will decide if we should follow-up with GYN or a different plan of care. If you develop bleeding again please let us know. Typically it is transient and does not re-occur.

## 2018-06-11 LAB — CERVICOVAGINAL ANCILLARY ONLY
Bacterial vaginitis: NEGATIVE
Candida vaginitis: NEGATIVE
Chlamydia: NEGATIVE
Neisseria Gonorrhea: NEGATIVE
TRICH (WINDOWPATH): NEGATIVE

## 2018-06-16 ENCOUNTER — Ambulatory Visit
Admission: RE | Admit: 2018-06-16 | Discharge: 2018-06-16 | Disposition: A | Discharge status: Home / Self Care | Payer: 59 | Source: Ambulatory Visit | Attending: Nurse Practitioner | Admitting: Nurse Practitioner

## 2018-06-16 ENCOUNTER — Other Ambulatory Visit: Payer: Self-pay

## 2018-06-16 DIAGNOSIS — R1032 Left lower quadrant pain: Secondary | ICD-10-CM | POA: Insufficient documentation

## 2018-06-16 DIAGNOSIS — N950 Postmenopausal bleeding: Secondary | ICD-10-CM | POA: Insufficient documentation

## 2019-02-05 ENCOUNTER — Telehealth: Payer: Self-pay | Admitting: Family Medicine

## 2019-02-05 DIAGNOSIS — Z1231 Encounter for screening mammogram for malignant neoplasm of breast: Secondary | ICD-10-CM

## 2019-02-05 NOTE — Telephone Encounter (Signed)
Pt had her physical in January and is needing an order for her mammogram to be sent to [facility]

## 2019-03-09 ENCOUNTER — Ambulatory Visit: Payer: 59

## 2019-05-03 ENCOUNTER — Encounter (INDEPENDENT_AMBULATORY_CARE_PROVIDER_SITE_OTHER): Payer: Self-pay

## 2019-05-03 ENCOUNTER — Ambulatory Visit: Payer: 59

## 2019-05-03 ENCOUNTER — Ambulatory Visit
Admission: RE | Admit: 2019-05-03 | Discharge: 2019-05-03 | Disposition: A | Discharge status: Home / Self Care | Payer: 59 | Source: Ambulatory Visit | Attending: Family Medicine | Admitting: Family Medicine

## 2019-05-03 ENCOUNTER — Other Ambulatory Visit: Payer: Self-pay

## 2019-05-03 DIAGNOSIS — Z1231 Encounter for screening mammogram for malignant neoplasm of breast: Secondary | ICD-10-CM | POA: Diagnosis present

## 2019-05-05 ENCOUNTER — Other Ambulatory Visit: Payer: Self-pay | Admitting: Family Medicine

## 2019-05-05 NOTE — Telephone Encounter (Signed)
Requested medication (s) are due for refill today: no  Requested medication (s) are on the active medication list: yes  Last refill:  08/15/2018  Future visit scheduled: no  Notes to clinic:  review for refill  Last filled by Podiatry office   Requested Prescriptions  Pending Prescriptions Disp Refills   meloxicam (MOBIC) 15 MG tablet [Pharmacy Med Name: Meloxicam 15 MG Oral Tablet] 90 tablet 0    Sig: TAKE 1 TABLET BY MOUTH ONCE DAILY AS NEEDED FOR PAIN      Analgesics:  COX2 Inhibitors Failed - 05/05/2019  5:58 AM      Failed - HGB in normal range and within 360 days    Hemoglobin  Date Value Ref Range Status  04/29/2018 13.3 11.7 - 15.5 g/dL Final  05/25/2015 14.5 11.1 - 15.9 g/dL Final          Failed - Cr in normal range and within 360 days    Creat  Date Value Ref Range Status  04/29/2018 0.73 0.50 - 1.05 mg/dL Final    Comment:    For patients >6 years of age, the reference limit for Creatinine is approximately 13% higher for people identified as African-American. Renella Cunas - Patient is not pregnant      Passed - Valid encounter within last 12 months    Recent Outpatient Visits           11 months ago Postmenopausal bleeding   [place], NP   1 year ago Preventative health care   [facility] L[facility], Satira Anis, MD   2 years ago Annual physical exam   [facility] Lada, Satira Anis, MD   2 years ago Essential hypertension, benign   [facility] Lada, Satira Anis, MD   3 years ago Obesity, Class III, BMI 40-49.9 (morbid obesity) [facility])   [facility] Lada, Satira Anis, MD

## 2019-05-05 NOTE — Telephone Encounter (Signed)
lvm to schedule appt.  

## 2019-05-18 ENCOUNTER — Other Ambulatory Visit: Payer: Self-pay

## 2019-05-18 ENCOUNTER — Encounter: Payer: Self-pay | Admitting: Family Medicine

## 2019-05-18 ENCOUNTER — Ambulatory Visit (INDEPENDENT_AMBULATORY_CARE_PROVIDER_SITE_OTHER): Payer: 59 | Admitting: Family Medicine

## 2019-05-18 VITALS — BP 132/86 | HR 90 | Temp 97.9°F | Resp 14 | Ht 66.0 in | Wt 261.1 lb

## 2019-05-18 DIAGNOSIS — M79671 Pain in right foot: Secondary | ICD-10-CM

## 2019-05-18 DIAGNOSIS — R7303 Prediabetes: Secondary | ICD-10-CM

## 2019-05-18 DIAGNOSIS — Z13220 Encounter for screening for lipoid disorders: Secondary | ICD-10-CM

## 2019-05-18 DIAGNOSIS — Z1329 Encounter for screening for other suspected endocrine disorder: Secondary | ICD-10-CM

## 2019-05-18 DIAGNOSIS — Z0000 Encounter for general adult medical examination without abnormal findings: Secondary | ICD-10-CM

## 2019-05-18 DIAGNOSIS — I10 Essential (primary) hypertension: Secondary | ICD-10-CM | POA: Diagnosis not present

## 2019-05-18 DIAGNOSIS — M79672 Pain in left foot: Secondary | ICD-10-CM

## 2019-05-18 DIAGNOSIS — E785 Hyperlipidemia, unspecified: Secondary | ICD-10-CM

## 2019-05-18 DIAGNOSIS — Z13228 Encounter for screening for other metabolic disorders: Secondary | ICD-10-CM

## 2019-05-18 DIAGNOSIS — Z130 Encounter for screening for diseases of the blood and blood-forming organs and certain disorders involving the immune mechanism: Secondary | ICD-10-CM

## 2019-05-18 MED ORDER — MELOXICAM 15 MG PO TABS: 15.0000 mg | ORAL_TABLET | ORAL | 3 refills | Status: DC | PRN

## 2019-05-18 MED ORDER — LISINOPRIL-HYDROCHLOROTHIAZIDE 20-12.5 MG PO TABS: 1.0000 | ORAL_TABLET | Freq: Every day | ORAL | 3 refills | Status: DC

## 2019-05-18 NOTE — Patient Instructions (Addendum)
How to Take Your Blood Pressure You can take your blood pressure at home with a machine. You may need to check your blood pressure at home:  To check if you have high blood pressure (hypertension).  To check your blood pressure over time.  To make sure your blood pressure medicine is working. Supplies needed: You will need a blood pressure machine, or monitor. You can buy one at a drugstore or online. When choosing one:  Choose one with an arm cuff.  Choose one that wraps around your upper arm. Only one finger should fit between your arm and the cuff.  Do not choose one that measures your blood pressure from your wrist or finger. Your doctor can suggest a monitor. How to prepare Avoid these things for 30 minutes before checking your blood pressure:  Drinking caffeine.  Drinking alcohol.  Eating.  Smoking.  Exercising. Five minutes before checking your blood pressure:  Pee.  Sit in a dining chair. Avoid sitting in a soft couch or armchair.  Be quiet. Do not talk. How to take your blood pressure Follow the instructions that came with your machine. If you have a digital blood pressure monitor, these may be the instructions: 1. Sit up straight. 2. Place your feet on the floor. Do not cross your ankles or legs. 3. Rest your left arm at the level of your heart. You may rest it on a table, desk, or chair. 4. Pull up your shirt sleeve. 5. Wrap the blood pressure cuff around the upper part of your left arm. The cuff should be 1 inch (2.5 cm) above your elbow. It is best to wrap the cuff around bare skin. 6. Fit the cuff snugly around your arm. You should be able to place only one finger between the cuff and your arm. 7. Put the cord inside the groove of your elbow. 8. Press the power button. 9. Sit quietly while the cuff fills with air and loses air. 10. Write down the numbers on the screen. 11. Wait 2-3 minutes and then repeat steps 1-10. What do the numbers mean? Two  numbers make up your blood pressure. The first number is called systolic pressure. The second is called diastolic pressure. An example of a blood pressure reading is "120 over 80" (or 120/80). If you are an adult and do not have a medical condition, use this guide to find out if your blood pressure is normal: Normal  First number: below 120.  Second number: below 80. Elevated  First number: 120-129.  Second number: below 80. Hypertension stage 1  First number: 130-139.  Second number: 80-89. Hypertension stage 2  First number: 140 or above.  Second number: 58 or above. Your blood pressure is above normal even if only the top or bottom number is above normal. Follow these instructions at home:  Check your blood pressure as often as your doctor tells you to.  Take your monitor to your next doctor's appointment. Your doctor will: ? Make sure you are using it correctly. ? Make sure it is working right.  Make sure you understand what your blood pressure numbers should be.  Tell your doctor if your medicines are causing side effects. Contact a doctor if:  Your blood pressure keeps being high. Get help right away if:  Your first blood pressure number is higher than 180.  Your second blood pressure number is higher than 120. This information is not intended to replace advice given to you by your health  care provider. Make sure you discuss any questions you have with your health care provider. Document Revised: 03/07/2017 Document Reviewed: 09/01/2015 Elsevier Patient Education  [place].    Blood Pressure Record Sheet  Blood pressure log Date: _______________________  a.m. _____________________(1st reading) _____________________(2nd reading)  p.m. _____________________(1st reading) _____________________(2nd reading) Date: _______________________  a.m. _____________________(1st reading) _____________________(2nd reading)  p.m. _____________________(1st  reading) _____________________(2nd reading) Date: _______________________  a.m. _____________________(1st reading) _____________________(2nd reading)  p.m. _____________________(1st reading) _____________________(2nd reading) Date: _______________________  a.m. _____________________(1st reading) _____________________(2nd reading)  p.m. _____________________(1st reading) _____________________(2nd reading) Date: _______________________  a.m. _____________________(1st reading) _____________________(2nd reading)  p.m. _____________________(1st reading) _____________________(2nd reading) Date: _______________________  a.m. _____________________(1st reading) _____________________(2nd reading)  p.m. _____________________(1st reading) _____________________(2nd reading) Date: _______________________  a.m. _____________________(1st reading) _____________________(2nd reading)  p.m. _____________________(1st reading) _____________________(2nd reading) Date: _______________________  a.m. _____________________(1st reading) _____________________(2nd reading)  p.m. _____________________(1st reading) _____________________(2nd reading) Date: _______________________  a.m. _____________________(1st reading) _____________________(2nd reading)  p.m. _____________________(1st reading) _____________________(2nd reading) Date: _______________________  a.m. _____________________(1st reading) _____________________(2nd reading)  p.m. _____________________(1st reading) _____________________(2nd reading)    Preventive Care 74-18 Years Old, Female Preventive care refers to visits with your health care provider and lifestyle choices that can promote health and wellness. This includes:  A yearly physical exam. This may also be called an annual well check.  Regular dental visits and eye exams.  Immunizations.  Screening for certain conditions.  Healthy lifestyle choices, such as eating a healthy  diet, getting regular exercise, not using drugs or products that contain nicotine and tobacco, and limiting alcohol use. What can I expect for my preventive care visit? Physical exam Your health care provider will check your:  Height and weight. This may be used to calculate body mass index (BMI), which tells if you are at a healthy weight.  Heart rate and blood pressure.  Skin for abnormal spots. Counseling Your health care provider may ask you questions about your:  Alcohol, tobacco, and drug use.  Emotional well-being.  Home and relationship well-being.  Sexual activity.  Eating habits.  Work and work Statistician.  Method of birth control.  Menstrual cycle.  Pregnancy history. What immunizations do I need?  Influenza (flu) vaccine  This is recommended every year. Tetanus, diphtheria, and pertussis (Tdap) vaccine  You may need a Td booster every 10 years. Varicella (chickenpox) vaccine  You may need this if you have not been vaccinated. Zoster (shingles) vaccine  You may need this after age 39. Measles, mumps, and rubella (MMR) vaccine  You may need at least one dose of MMR if you were born in 1957 or later. You may also need a second dose. Pneumococcal conjugate (PCV13) vaccine  You may need this if you have certain conditions and were not previously vaccinated. Pneumococcal polysaccharide (PPSV23) vaccine  You may need one or two doses if you smoke cigarettes or if you have certain conditions. Meningococcal conjugate (MenACWY) vaccine  You may need this if you have certain conditions. Hepatitis A vaccine  You may need this if you have certain conditions or if you travel or work in places where you may be exposed to hepatitis A. Hepatitis B vaccine  You may need this if you have certain conditions or if you travel or work in places where you may be exposed to hepatitis B. Haemophilus influenzae type b (Hib) vaccine  You may need this if you have  certain conditions. Human papillomavirus (HPV) vaccine  If recommended by your health care provider, you may need three doses over  6 months. You may receive vaccines as individual doses or as more than one vaccine together in one shot (combination vaccines). Talk with your health care provider about the risks and benefits of combination vaccines. What tests do I need? Blood tests  Lipid and cholesterol levels. These may be checked every 5 years, or more frequently if you are over 90 years old.  Hepatitis C test.  Hepatitis B test. Screening  Lung cancer screening. You may have this screening every year starting at age 28 if you have a 30-pack-year history of smoking and currently smoke or have quit within the past 15 years.  Colorectal cancer screening. All adults should have this screening starting at age 63 and continuing until age 69. Your health care provider may recommend screening at age 48 if you are at increased risk. You will have tests every 1-10 years, depending on your results and the type of screening test.  Diabetes screening. This is done by checking your blood sugar (glucose) after you have not eaten for a while (fasting). You may have this done every 1-3 years.  Mammogram. This may be done every 1-2 years. Talk with your health care provider about when you should start having regular mammograms. This may depend on whether you have a family history of breast cancer.  BRCA-related cancer screening. This may be done if you have a family history of breast, ovarian, tubal, or peritoneal cancers.  Pelvic exam and Pap test. This may be done every 3 years starting at age 67. Starting at age 14, this may be done every 5 years if you have a Pap test in combination with an HPV test. Other tests  Sexually transmitted disease (STD) testing.  Bone density scan. This is done to screen for osteoporosis. You may have this scan if you are at high risk for osteoporosis. Follow these  instructions at home: Eating and drinking  Eat a diet that includes fresh fruits and vegetables, whole grains, lean protein, and low-fat dairy.  Take vitamin and mineral supplements as recommended by your health care provider.  Do not drink alcohol if: ? Your health care provider tells you not to drink. ? You are pregnant, may be pregnant, or are planning to become pregnant.  If you drink alcohol: ? Limit how much you have to 0-1 drink a day. ? Be aware of how much alcohol is in your drink. In the [place] one drink equals one 12 oz bottle of beer (355 mL), one 5 oz glass of wine (148 mL), or one 1 oz glass of hard liquor (44 mL). Lifestyle  Take daily care of your teeth and gums.  Stay active. Exercise for at least 30 minutes on 5 or more days each week.  Do not use any products that contain nicotine or tobacco, such as cigarettes, e-cigarettes, and chewing tobacco. If you need help quitting, ask your health care provider.  If you are sexually active, practice safe sex. Use a condom or other form of birth control (contraception) in order to prevent pregnancy and STIs (sexually transmitted infections).  If told by your health care provider, take low-dose aspirin daily starting at age 56. What's next?  Visit your health care provider once a year for a well check visit.  Ask your health care provider how often you should have your eyes and teeth checked.  Stay up to date on all vaccines. This information is not intended to replace advice given to you by your health care provider. Make  you discuss any questions you have with your health care provider. Document Revised: 12/04/2017 Document Reviewed: 12/04/2017 Elsevier Patient Education  2020 [employer]  

## 2019-05-18 NOTE — Progress Notes (Signed)
Patient: Amber Murray, Female    DOB: 01/30/1961, 59 y.o.   MRN: [id number] Delsa Grana, PA-C Visit Date: 05/18/2019  Today's Provider: Delsa Grana, PA-C   Chief Complaint  Patient presents with  . Annual Exam   Subjective:   Annual physical exam:  Amber Murray is a 59 y.o. 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  She has lost some weight with increased physical activity and healthy diet Wt Readings from Last 5 Encounters:  05/18/19 261 lb 1.6 oz (118.4 kg)  06/09/18 276 lb 1.6 oz (125.2 kg)  04/29/18 277 lb 1.6 oz (125.7 kg)  03/24/17 266 lb 12.8 oz (121 kg)  02/17/17 277 lb (125.6 kg)   BMI Readings from Last 5 Encounters:  05/18/19 42.14 kg/m  06/09/18 45.95 kg/m  04/29/18 45.41 kg/m  03/24/17 43.03 kg/m  02/17/17 46.10 kg/m   HTN-she is managing essential hypertension with hydrochlorothiazide 25 mg daily she states he gets her severe muscle cramps and spasms especially in her side ribs and sometimes in her abdomen.  She has been on this medication for several years, for started treating her blood pressure about 4 years ago.  She has tried amlodipine before.  She has not been on an ACE inhibitor or an ARB.  She reports drinking a lot of Gatorade and Powerade to try and prevent the cramping and muscle spasms that she experiences.  Blood pressure today is slightly elevated but still at goal she states that she was rushing.  She denies any chest pain, shortness of breath, palpitations, orthopnea, PND, lower extremity edema, headaches, visual disturbances  Patient also had Covid a few months ago and is concerned about some exertional dyspnea and fatigue that has gradually improved.  She states November to December is very difficult to  go back to work doing her job felt easily fatigued or out of breath with walking any distance.  She has gradually improved but she is concerned about how her lungs sound today  HLD-patient has elevated cholesterol she has worked on improving her diet Lab Results  Component Value Date   CHOL 179 04/29/2018   HDL 52 04/29/2018   LDLCALC 110 (H) 04/29/2018   TRIG 80 04/29/2018   CHOLHDL 3.4 04/29/2018  She has not previously taken a statin.  No family hx of early heart attack or stroke in immediate family member   Reviewed and discussed today ASCVD risk calculator, indications for statins, MOA and benefits of statins   Mammogram was recently completed we reviewed her results today  USPSTF grade A and B recommendations - reviewed and addressed today  Depression:  Phq 9 completed today by patient, was reviewed by me with patient in the room, score is  negative, pt feels good, moods good PHQ 2/9 Scores 05/18/2019 06/09/2018 04/29/2018 03/24/2017  PHQ - 2 Score 0 0 0 0  PHQ- 9 Score 0 0 0 -   Depression screen [facility] 2/9 05/18/2019 06/09/2018 04/29/2018 03/24/2017 02/17/2017  Decreased Interest 0 0 0 0 0  Down, Depressed, Hopeless 0 0 0 0 0  PHQ - 2 Score 0 0 0 0 0  Altered sleeping 0 0 0 - -  Tired, decreased energy 0 0 0 - -  Change in appetite 0 0 0 - -  Feeling bad or failure about yourself  0 0 0 - -  Trouble concentrating 0 0 0 - -  Moving slowly or fidgety/restless 0 0 0 - -  Suicidal thoughts 0 0 0 - -  PHQ-9 Score 0 0 0 - -  Difficult doing work/chores Not difficult at all Not difficult at all Not difficult at all - -    Alcohol screening:   Office Visit from 05/18/2019 in [facility]  AUDIT-C Score  2      Immunizations and Health Maintenance: Health Maintenance  Topic Date Due  . INFLUENZA VACCINE  07/07/2019 (Originally 11/07/2018)  . PAP SMEAR-Modifier  03/24/2020  . MAMMOGRAM  05/02/2020  . COLONOSCOPY  06/15/2020  . TETANUS/TDAP  04/27/2025  . Hepatitis  C Screening  Completed  . HIV Screening  Completed     Hep C Screening: none STD testing and prevention (HIV/chl/gon/syphilis): none needed Intimate partner violence:  Feels safe  Sexual History/Pain during Intercourse:  no Married  Menstrual History/LMP/Abnormal Bleeding: no AUB - did last year and went to GYN No LMP recorded. Patient is postmenopausal.  Incontinence Symptoms: none  Breast cancer:  Last Mammogram: just completed BRCA gene screening:  No testing done  Cervical cancer screening: utd Family hx of cancers - breast, ovarian, uterine, colon:   Colon CA in brother 21 Sister breast CA   Osteoporosis:   Discussed high calcium and vitamin D supplementation, weight bearing exercises  Skin cancer:  Hx of skin CA -  NO Discussed atypical lesions   Colorectal cancer:   colonoscopy is UTD doing 5 year screenings with family hx- sees Dr. Allen Norris  Lung cancer:   Low Dose CT Chest recommended if Age 33-80 years, 30 pack-year currently smoking OR have quit w/in 15years. Patient does not qualify.   Social History   Tobacco Use  . Smoking status: Former Smoker    Quit date: 04/27/2005    Years since quitting: 14.0  . Smokeless tobacco: Never Used  Substance Use Topics  . Alcohol use: Yes    Alcohol/week: 2.0 standard drinks    Types: 2 Shots of liquor per week     ECG:  None needed  Blood pressure/Hypertension: BP Readings from Last 3 Encounters:  05/18/19 132/86  06/09/18 110/74  05/20/18 117/72    Weight/Obesity: Wt Readings from Last 3 Encounters:  05/18/19 261 lb 1.6 oz (118.4 kg)  06/09/18 276 lb 1.6 oz (125.2 kg)  04/29/18 277 lb 1.6 oz (125.7 kg)   BMI Readings from Last 3 Encounters:  05/18/19 42.14 kg/m  06/09/18 45.95 kg/m  04/29/18 45.41 kg/m     Lipids:  Lab Results  Component Value Date   CHOL 179 04/29/2018   CHOL 172 03/10/2017   CHOL 181 12/18/2015   Lab Results  Component Value Date   HDL 52 04/29/2018   HDL 51 03/10/2017    HDL 44 (L) 12/18/2015   Lab Results  Component Value Date   LDLCALC 110 (H) 04/29/2018   LDLCALC 104 (H) 03/10/2017   LDLCALC 106 12/18/2015   Lab Results  Component Value Date   TRIG 80 04/29/2018   TRIG 78 03/10/2017   TRIG 156 (H) 12/18/2015   Lab Results  Component Value Date   CHOLHDL 3.4 04/29/2018   CHOLHDL 3.4 03/10/2017   CHOLHDL 4.1 12/18/2015   No results found for: LDLDIRECT Based on the results of lipid panel his/her cardiovascular risk factor ( using [facility] )  in the next  10 years is: The 10-year ASCVD risk score Mikey Bussing DC Brooke Bonito., et al., 2013) is: 6.2%   Values used to calculate the score:     Age: 84 years     Sex: Female     Is Non-Hispanic African American: Yes     Diabetic: No     Tobacco smoker: No     Systolic Blood Pressure: 220 mmHg     Is BP treated: Yes     HDL Cholesterol: 52 mg/dL     Total Cholesterol: 179 mg/dL  Glucose:  Glucose, Bld  Date Value Ref Range Status  04/29/2018 91 65 - 99 mg/dL Final    Comment:    .            Fasting reference interval .   03/10/2017 105 (H) 65 - 99 mg/dL Final    Comment:    .            Fasting reference interval . For someone without known diabetes, a glucose value between 100 and 125 mg/dL is consistent with prediabetes and should be confirmed with a follow-up test. .   12/18/2015 93 65 - 99 mg/dL Final      Office Visit from 05/18/2019 in [facility]  AUDIT-C Score  2     Depression: Phq 9 is  negative Depression screen [facility] 2/9 05/18/2019 06/09/2018 04/29/2018 03/24/2017 02/17/2017  Decreased Interest 0 0 0 0 0  Down, Depressed, Hopeless 0 0 0 0 0  PHQ - 2 Score 0 0 0 0 0  Altered sleeping 0 0 0 - -  Tired, decreased energy 0 0 0 - -  Change in appetite 0 0 0 - -  Feeling bad or failure about yourself  0 0 0 - -  Trouble concentrating 0 0 0 - -  Moving slowly or fidgety/restless 0 0 0 - -  Suicidal thoughts 0 0 0 - -  PHQ-9 Score 0 0 0 - -  Difficult doing  work/chores Not difficult at all Not difficult at all Not difficult at all - -   Hypertension: BP Readings from Last 3 Encounters:  05/18/19 132/86  06/09/18 110/74  05/20/18 117/72   Obesity: Wt Readings from Last 3 Encounters:  05/18/19 261 lb 1.6 oz (118.4 kg)  06/09/18 276 lb 1.6 oz (125.2 kg)  04/29/18 277 lb 1.6 oz (125.7 kg)   BMI Readings from Last 3 Encounters:  05/18/19 42.14 kg/m  06/09/18 45.95 kg/m  04/29/18 45.41 kg/m     Advanced Care Planning:  Did not discuss today  Social History      She  reports that she quit smoking about 14 years ago. She has never used smokeless tobacco. She reports current alcohol use of about 2.0 standard drinks of alcohol per week. She reports current drug use. Drug: Marijuana.       Social History   Socioeconomic History  . Marital status: Married    Spouse name: Jenny Reichmann  . Number of children: 3  . Years of education: 34  . Highest education level: Some college, no degree  Occupational History  . Not on file  Tobacco Use  . Smoking status: Former Smoker    Quit date: 04/27/2005    Years since quitting: 14.0  . Smokeless tobacco: Never Used  Substance and Sexual Activity  . Alcohol use: Yes    Alcohol/week: 2.0 standard drinks    Types: 2 Shots of liquor per week  . Drug use: Yes  Types: Marijuana    Comment: 1x/mo - last time 06/09/15  . Sexual activity: Yes    Birth control/protection: Post-menopausal  Other Topics Concern  . Not on file  Social History Narrative  . Not on file   Social Determinants of Health   Financial Resource Strain:   . Difficulty of Paying Living Expenses: Not on file  Food Insecurity:   . Worried About Charity fundraiser in the Last Year: Not on file  . Ran Out of Food in the Last Year: Not on file  Transportation Needs:   . Lack of Transportation (Medical): Not on file  . Lack of Transportation (Non-Medical): Not on file  Physical Activity:   . Days of Exercise per Week: Not on file   . Minutes of Exercise per Session: Not on file  Stress:   . Feeling of Stress : Not on file  Social Connections:   . Frequency of Communication with Friends and Family: Not on file  . Frequency of Social Gatherings with Friends and Family: Not on file  . Attends Religious Services: Not on file  . Active Member of Clubs or Organizations: Not on file  . Attends Archivist Meetings: Not on file  . Marital Status: Not on file    Family History        Family Status  Relation Name Status  . Mother  Deceased  . Sister  Alive  . Brother  Deceased  . Father  Deceased  . Sister  Alive  . Sister  Alive  . Brother  Alive  . Brother  Deceased  . Brother  Deceased  . Brother  Deceased  . Brother  Deceased  . Other niece Deceased        Her family history includes Breast cancer in an other family member; Breast cancer (age of onset: 12) in her sister; Cancer in her brother, sister, and another family member; Cerebral palsy in her brother; Colon cancer in her brother; HIV/AIDS in her brother; Hypertension in her father and mother; Kidney disease in her brother and mother.       Family History  Problem Relation Age of Onset  . Kidney disease Mother   . Hypertension Mother   . Cancer Sister        breast  . Breast cancer Sister 65  . Cancer Brother        colon  . Kidney disease Brother   . Hypertension Father   . Cerebral palsy Brother   . Colon cancer Brother   . HIV/AIDS Brother   . Cancer Other   . Breast cancer Other     Patient Active Problem List   Diagnosis Date Noted  . Preventative health care 04/29/2018  . Leg cramps 02/17/2017  . Encounter for medication monitoring 12/18/2015  . Essential hypertension, benign 11/14/2015  . Shortness of breath 11/14/2015  . Prediabetes 11/13/2015  . Medication monitoring encounter 11/13/2015  . Special screening for malignant neoplasms, colon   . Benign neoplasm of rectosigmoid junction   . Family history of breast  cancer 04/28/2015  . Family history of colon cancer 04/28/2015  . Right knee pain 04/28/2015  . Obesity, Class III, BMI 40-49.9 (morbid obesity) ([place]) 04/28/2015  . Screening for STD (sexually transmitted disease) 04/28/2015  . Encounter for screening mammogram for malignant neoplasm of breast 04/28/2015  . Need for diphtheria-tetanus-pertussis (Tdap) vaccine, adult/adolescent 04/28/2015    Past Surgical History:  Procedure Laterality Date  . CHOLECYSTECTOMY    .  COLONOSCOPY WITH PROPOFOL N/A 06/16/2015   Procedure: COLONOSCOPY WITH PROPOFOL;  Surgeon: Lucilla Lame, MD;  Location: [facility];  Service: Endoscopy;  Laterality: N/A;  . POLYPECTOMY  06/16/2015   Procedure: POLYPECTOMY INTESTINAL;  Surgeon: Lucilla Lame, MD;  Location: [facility];  Service: Endoscopy;;  Sigmoid colon polyp     Current Outpatient Medications:  .  aspirin EC 81 MG tablet, Take 81 mg by mouth daily., Disp: , Rfl:  .  Cholecalciferol (VITAMIN D3 PO), Take by mouth., Disp: , Rfl:  .  Cyanocobalamin (VITAMIN B-12 PO), Take 1,000 mcg by mouth 2 (two) times daily., Disp: , Rfl:  .  hydrochlorothiazide (HYDRODIURIL) 25 MG tablet, Take 1 tablet (25 mg total) by mouth daily., Disp: 90 tablet, Rfl: 3 .  meloxicam (MOBIC) 15 MG tablet, Take 1 tablet (15 mg total) by mouth daily. (Patient taking differently: Take 15 mg by mouth as needed. ), Disp: 30 tablet, Rfl: 3  No Known Allergies  Patient Care Team: Delsa Grana, PA-C as PCP - General (Family Medicine)  Review of Systems  Constitutional: Negative.  Negative for activity change, appetite change, fatigue and unexpected weight change.  HENT: Negative.   Eyes: Negative.   Respiratory: Negative.  Negative for shortness of breath.   Cardiovascular: Negative.  Negative for chest pain, palpitations and leg swelling.  Gastrointestinal: Negative.  Negative for abdominal pain and blood in stool.  Endocrine: Negative.   Genitourinary: Negative.     Musculoskeletal: Negative.  Negative for arthralgias, gait problem, joint swelling and myalgias.  Skin: Negative.  Negative for color change, pallor and rash.  Allergic/Immunologic: Negative.   Neurological: Negative.  Negative for syncope and weakness.  Hematological: Negative.   Psychiatric/Behavioral: Negative.  Negative for confusion, dysphoric mood, self-injury and suicidal ideas. The patient is not nervous/anxious.   All other systems reviewed and are negative.    I personally reviewed active problem list, medication list, allergies, family history, social history, health maintenance, notes from last encounter, lab results, imaging with the patient/caregiver today.        Objective:   Vitals:  Vitals:   05/18/19 1523  BP: 132/86  Pulse: 90  Resp: 14  Temp: 97.9 F (36.6 C)  SpO2: 93%  Weight: 261 lb 1.6 oz (118.4 kg)  Height: '5\' 6"'$  (1.676 m)    Body mass index is 42.14 kg/m.  Physical Exam Vitals and nursing note reviewed.  Constitutional:      General: She is not in acute distress.    Appearance: Normal appearance. She is well-developed. She is obese. She is not ill-appearing, toxic-appearing or diaphoretic.     Interventions: Face mask in place.  HENT:     Head: Normocephalic and atraumatic.     Right Ear: External ear normal.     Left Ear: External ear normal.  Eyes:     General: Lids are normal. No scleral icterus.       Right eye: No discharge.        Left eye: No discharge.     Conjunctiva/sclera: Conjunctivae normal.  Neck:     Thyroid: No thyroid mass, thyromegaly or thyroid tenderness.     Trachea: Phonation normal. No tracheal deviation.  Cardiovascular:     Rate and Rhythm: Normal rate and regular rhythm.     Pulses: Normal pulses.          Radial pulses are 2+ on the right side and 2+ on the left side.  Posterior tibial pulses are 2+ on the right side and 2+ on the left side.     Heart sounds: Normal heart sounds. No murmur. No friction  rub. No gallop.   Pulmonary:     Effort: Pulmonary effort is normal. No respiratory distress.     Breath sounds: Normal breath sounds. No stridor. No wheezing, rhonchi or rales.  Chest:     Chest wall: No tenderness.  Abdominal:     General: Bowel sounds are normal. There is no distension.     Palpations: Abdomen is soft.     Tenderness: There is no abdominal tenderness. There is no guarding or rebound.  Musculoskeletal:        General: No deformity. Normal range of motion.     Cervical back: Normal range of motion and neck supple.     Right lower leg: No edema.     Left lower leg: No edema.  Lymphadenopathy:     Cervical: No cervical adenopathy.  Skin:    General: Skin is warm and dry.     Capillary Refill: Capillary refill takes less than 2 seconds.     Coloration: Skin is not jaundiced or pale.     Findings: No rash.  Neurological:     Mental Status: She is alert and oriented to person, place, and time.     Motor: No abnormal muscle tone.     Gait: Gait normal.  Psychiatric:        Speech: Speech normal.        Behavior: Behavior normal.       Fall Risk: Fall Risk  05/18/2019 06/09/2018 04/29/2018 03/24/2017 02/17/2017  Falls in the past year? 0 0 0 No No  Number falls in past yr: 0 0 0 - -  Injury with Fall? 0 0 0 - -  Follow up - Falls evaluation completed - - -    Functional Status Survey: Is the patient deaf or have difficulty hearing?: No Does the patient have difficulty seeing, even when wearing glasses/contacts?: No Does the patient have difficulty concentrating, remembering, or making decisions?: No Does the patient have difficulty walking or climbing stairs?: No Does the patient have difficulty dressing or bathing?: No Does the patient have difficulty doing errands alone such as visiting a doctor's office or shopping?: No   Assessment & Plan:    CPE completed today  . USPSTF grade A and B recommendations reviewed with patient; age-appropriate  recommendations, preventive care, screening tests, etc discussed and encouraged; healthy living encouraged; see AVS for patient education given to patient  . Discussed importance of 150 minutes of physical activity weekly, AHA exercise recommendations given to pt in AVS/handout  . Discussed importance of healthy diet:  eating lean meats and proteins, avoiding trans fats and saturated fats, avoid simple sugars and excessive carbs in diet, eat 6 servings of fruit/vegetables daily and drink plenty of water and avoid sweet beverages.    . Recommended pt to do annual eye exam and routine dental exams/cleanings  . Depression, alcohol, fall screening completed as documented above and per flowsheets  . Reviewed Health Maintenance: Health Maintenance  Topic Date Due  . INFLUENZA VACCINE  07/07/2019 (Originally 11/07/2018)  . PAP SMEAR-Modifier  03/24/2020  . MAMMOGRAM  05/02/2020  . COLONOSCOPY  06/15/2020  . TETANUS/TDAP  04/27/2025  . Hepatitis C Screening  Completed  . HIV Screening  Completed    . Immunizations: Immunization History  Administered Date(s) Administered  . Tdap 04/28/2015  ICD-10-CM   1. Adult general medical exam  Z00.00 CBC with Differential/Platelet    COMPLETE METABOLIC PANEL WITH GFR    Lipid panel    TSH    Hemoglobin A1c  2. Essential hypertension, benign  I10 lisinopril-hydrochlorothiazide (ZESTORETIC) 20-12.5 MG tablet   BP controlled, stable, med SE, will d/c HCTZ 25 mg and start lisinopril-HCTZ 20-12.5 and do 2 week f/up  3. Screening for lipoid disorders  W37.542 COMPLETE METABOLIC PANEL WITH GFR    Lipid panel  4. Screening for endocrine, metabolic and immunity disorder  Z13.29 CBC with Differential/Platelet   L70.230 COMPLETE METABOLIC PANEL WITH GFR   Z13.0 Lipid panel    TSH    Hemoglobin A1c  5. Obesity, Class III, BMI 40-49.9 (morbid obesity) (HCC) Chronic E66.01 CBC with Differential/Platelet    COMPLETE METABOLIC PANEL WITH GFR    Lipid panel      TSH    Hemoglobin A1c   some weight loss, encouraged continued efforts for healthy diet, exercise and calorie deficit  6. Bilateral foot pain  M79.671 meloxicam (MOBIC) 15 MG tablet   M79.672    intermittent plantar fasciitis symptoms she requests refill on Mobic which she uses very sparingly maybe 1-2 times a week  7. Prediabetes  N72.09 COMPLETE METABOLIC PANEL WITH GFR    Hemoglobin A1c  8. Hyperlipidemia, unspecified hyperlipidemia type  P06.8 COMPLETE METABOLIC PANEL WITH GFR    Lipid panel   Discussed her ASCVD risk and modifiable factors encouraged healthy diet and exercise     Delsa Grana, PA-C 05/18/19 3:30 PM  [facility]

## 2019-05-19 LAB — CBC WITH DIFFERENTIAL/PLATELET
Absolute Monocytes: 381 cells/uL (ref 200–950)
Basophils Absolute: 52 cells/uL (ref 0–200)
Basophils Relative: 1.1 %
Eosinophils Absolute: 202 cells/uL (ref 15–500)
Eosinophils Relative: 4.3 %
HCT: 40.8 % (ref 35.0–45.0)
Hemoglobin: 13.8 g/dL (ref 11.7–15.5)
Lymphs Abs: 2411 cells/uL (ref 850–3900)
MCH: 29.8 pg (ref 27.0–33.0)
MCHC: 33.8 g/dL (ref 32.0–36.0)
MCV: 88.1 fL (ref 80.0–100.0)
MPV: 10.1 fL (ref 7.5–12.5)
Monocytes Relative: 8.1 %
Neutro Abs: 1654 cells/uL (ref 1500–7800)
Neutrophils Relative %: 35.2 %
Platelets: 271 10*3/uL (ref 140–400)
RBC: 4.63 10*6/uL (ref 3.80–5.10)
RDW: 12.6 % (ref 11.0–15.0)
Total Lymphocyte: 51.3 %
WBC: 4.7 10*3/uL (ref 3.8–10.8)

## 2019-05-19 LAB — COMPLETE METABOLIC PANEL WITH GFR
AG Ratio: 1.5 (calc) (ref 1.0–2.5)
ALT: 17 U/L (ref 6–29)
AST: 18 U/L (ref 10–35)
Albumin: 4.4 g/dL (ref 3.6–5.1)
Alkaline phosphatase (APISO): 98 U/L (ref 37–153)
BUN: 13 mg/dL (ref 7–25)
CO2: 26 mmol/L (ref 20–32)
Calcium: 9.6 mg/dL (ref 8.6–10.4)
Chloride: 103 mmol/L (ref 98–110)
Creat: 0.68 mg/dL (ref 0.50–1.05)
GFR, Est African American: 112 mL/min/{1.73_m2} (ref >=60)
GFR, Est Non African American: 96 mL/min/{1.73_m2} (ref >=60)
Globulin: 3 g/dL (calc) (ref 1.9–3.7)
Glucose, Bld: 88 mg/dL (ref 65–99)
Potassium: 3.7 mmol/L (ref 3.5–5.3)
Sodium: 138 mmol/L (ref 135–146)
Total Bilirubin: 0.3 mg/dL (ref 0.2–1.2)
Total Protein: 7.4 g/dL (ref 6.1–8.1)

## 2019-05-19 LAB — LIPID PANEL
Cholesterol: 216 mg/dL — ABNORMAL HIGH (ref <200)
HDL: 63 mg/dL (ref >=50)
LDL Cholesterol (Calc): 132 mg/dL (calc) — ABNORMAL HIGH
Non-HDL Cholesterol (Calc): 153 mg/dL (calc) — ABNORMAL HIGH (ref <130)
Total CHOL/HDL Ratio: 3.4 (calc) (ref <5.0)
Triglycerides: 107 mg/dL (ref <150)

## 2019-05-19 LAB — TSH: TSH: 2.49 mIU/L (ref 0.40–4.50)

## 2019-05-19 LAB — HEMOGLOBIN A1C
Hgb A1c MFr Bld: 5.6 % of total Hgb (ref <5.7)
Mean Plasma Glucose: 114 (calc)
eAG (mmol/L): 6.3 (calc)

## 2019-05-25 ENCOUNTER — Encounter: Payer: 59 | Admitting: Family Medicine

## 2019-07-09 ENCOUNTER — Ambulatory Visit: Payer: 59 | Attending: Internal Medicine

## 2019-07-09 DIAGNOSIS — Z23 Encounter for immunization: Secondary | ICD-10-CM

## 2019-07-09 NOTE — Progress Notes (Signed)
   Covid-19 Vaccination Clinic  Name:  Latashia Timberlake    MRN: [id number] DOB: [birth year]  07/09/2019  Ms. Riff was observed post Covid-19 immunization for 15 minutes without incident. She was provided with Vaccine Information Sheet and instruction to access the V-Safe system.   Ms. Hubby was instructed to call 911 with any severe reactions post vaccine: Marland Kitchen Difficulty breathing  . Swelling of face and throat  . A fast heartbeat  . A bad rash all over body  . Dizziness and weakness   Immunizations Administered    Name Date Dose VIS Date Route   Pfizer COVID-19 Vaccine 07/09/2019 10:13 AM 0.3 mL 03/19/2019 Intramuscular   Manufacturer: [place]   Lot: [id number]   [place]: [id number]

## 2019-08-03 ENCOUNTER — Ambulatory Visit: Payer: 59 | Attending: Internal Medicine

## 2019-08-03 DIAGNOSIS — Z23 Encounter for immunization: Secondary | ICD-10-CM

## 2019-08-03 NOTE — Progress Notes (Signed)
   Covid-19 Vaccination Clinic  Name:  Amber Murray    MRN: [id number] DOB: 05-26-1960  08/03/2019  [name] was observed post Covid-19 immunization for 15 minutes without incident. She was provided with Vaccine Information Sheet and instruction to access the V-Safe system.   [name] was instructed to call 911 with any severe reactions post vaccine: Marland Kitchen Difficulty breathing  . Swelling of face and throat  . A fast heartbeat  . A bad rash all over body  . Dizziness and weakness   Immunizations Administered    Name Date Dose VIS Date Route   Pfizer COVID-19 Vaccine 08/03/2019  3:00 PM 0.3 mL 06/02/2018 Intramuscular   Manufacturer: [place]   Lot: [id number]   [place]: [id number]

## 2020-05-01 ENCOUNTER — Other Ambulatory Visit: Payer: Self-pay | Admitting: Family Medicine

## 2020-05-01 ENCOUNTER — Telehealth: Payer: Self-pay

## 2020-05-01 DIAGNOSIS — Z1231 Encounter for screening mammogram for malignant neoplasm of breast: Secondary | ICD-10-CM

## 2020-05-01 NOTE — Telephone Encounter (Signed)
Copied from [place] [id number]. Topic: Referral - Request for Referral >> May 01, 2020 11:05 AM Celene Kras wrote: Has patient seen PCP for this complaint? Yes.   *If NO, is insurance requiring patient see PCP for this issue before PCP can refer them? Referral for which specialty: Mammogram Preferred provider/office: [facility]  Reason for referral: Yearly mammogram. Callback # [phone]

## 2020-05-31 ENCOUNTER — Ambulatory Visit (INDEPENDENT_AMBULATORY_CARE_PROVIDER_SITE_OTHER): Payer: 59 | Admitting: Family Medicine

## 2020-05-31 ENCOUNTER — Encounter: Payer: Self-pay | Admitting: Family Medicine

## 2020-05-31 ENCOUNTER — Other Ambulatory Visit: Payer: Self-pay

## 2020-05-31 ENCOUNTER — Other Ambulatory Visit (HOSPITAL_COMMUNITY)
Admission: RE | Admit: 2020-05-31 | Discharge: 2020-05-31 | Disposition: A | Discharge status: Home / Self Care | Payer: 59 | Source: Ambulatory Visit | Attending: Family Medicine | Admitting: Family Medicine

## 2020-05-31 VITALS — BP 126/84 | HR 98 | Temp 98.2°F | Resp 18 | Ht 66.0 in | Wt 267.3 lb

## 2020-05-31 DIAGNOSIS — Z0000 Encounter for general adult medical examination without abnormal findings: Secondary | ICD-10-CM

## 2020-05-31 DIAGNOSIS — I10 Essential (primary) hypertension: Secondary | ICD-10-CM | POA: Diagnosis not present

## 2020-05-31 DIAGNOSIS — Z124 Encounter for screening for malignant neoplasm of cervix: Secondary | ICD-10-CM | POA: Diagnosis present

## 2020-05-31 DIAGNOSIS — B3731 Acute candidiasis of vulva and vagina: Secondary | ICD-10-CM

## 2020-05-31 DIAGNOSIS — Z1231 Encounter for screening mammogram for malignant neoplasm of breast: Secondary | ICD-10-CM

## 2020-05-31 DIAGNOSIS — B373 Candidiasis of vulva and vagina: Secondary | ICD-10-CM

## 2020-05-31 DIAGNOSIS — M79671 Pain in right foot: Secondary | ICD-10-CM

## 2020-05-31 DIAGNOSIS — Z780 Asymptomatic menopausal state: Secondary | ICD-10-CM

## 2020-05-31 DIAGNOSIS — E785 Hyperlipidemia, unspecified: Secondary | ICD-10-CM

## 2020-05-31 DIAGNOSIS — Z1211 Encounter for screening for malignant neoplasm of colon: Secondary | ICD-10-CM

## 2020-05-31 DIAGNOSIS — R7303 Prediabetes: Secondary | ICD-10-CM

## 2020-05-31 DIAGNOSIS — M79672 Pain in left foot: Secondary | ICD-10-CM

## 2020-05-31 DIAGNOSIS — R3915 Urgency of urination: Secondary | ICD-10-CM | POA: Diagnosis not present

## 2020-05-31 LAB — POCT URINALYSIS DIPSTICK
Bilirubin, UA: NEGATIVE
Blood, UA: NEGATIVE
Glucose, UA: NEGATIVE
Ketones, UA: NEGATIVE
Leukocytes, UA: NEGATIVE
Nitrite, UA: NEGATIVE
Odor: NORMAL
Protein, UA: NEGATIVE
Spec Grav, UA: 1.015 (ref 1.010–1.025)
Urobilinogen, UA: 0.2 E.U./dL
pH, UA: 5 (ref 5.0–8.0)

## 2020-05-31 MED ORDER — MELOXICAM 15 MG PO TABS: 15.0000 mg | ORAL_TABLET | ORAL | 3 refills | Status: DC | PRN

## 2020-05-31 MED ORDER — FLUCONAZOLE 150 MG PO TABS: 150.0000 mg | ORAL_TABLET | ORAL | 0 refills | Status: DC | PRN

## 2020-05-31 NOTE — Patient Instructions (Addendum)
Health Maintenance  Topic Date Due  . Flu Shot  Never done  . COVID-19 Vaccine (3 - Booster for Pfizer series) 02/02/2020  . Pap Smear  03/24/2020  . Mammogram  05/02/2020  . Colon Cancer Screening  06/15/2020  . Tetanus Vaccine  04/27/2025  .  Hepatitis C: One time screening is recommended by Center for Disease Control  (CDC) for  adults born from [birth year] through 1965.   Completed  . HIV Screening  Completed    [facility] [place],  [place]  [id number] Get Driving Directions Main: [phone]     Preventive Care 60-89 Years Old, Female Preventive care refers to lifestyle choices and visits with your health care provider that can promote health and wellness. This includes:  A yearly physical exam. This is also called an annual wellness visit.  Regular dental and eye exams.  Immunizations.  Screening for certain conditions.  Healthy lifestyle choices, such as: ? Eating a healthy diet. ? Getting regular exercise. ? Not using drugs or products that contain nicotine and tobacco. ? Limiting alcohol use. What can I expect for my preventive care visit? Physical exam Your health care provider will check your:  Height and weight. These may be used to calculate your BMI (body mass index). BMI is a measurement that tells if you are at a healthy weight.  Heart rate and blood pressure.  Body temperature.  Skin for abnormal spots. Counseling Your health care provider may ask you questions about your:  Past medical problems.  Family's medical history.  Alcohol, tobacco, and drug use.  Emotional well-being.  Home life and relationship well-being.  Sexual activity.  Diet, exercise, and sleep habits.  Work and work Statistician.  Access to firearms.  Method of birth control.  Menstrual cycle.  Pregnancy history. What immunizations do I need? Vaccines are usually given at various ages, according to a schedule.  Your health care provider will recommend vaccines for you based on your age, medical history, and lifestyle or other factors, such as travel or where you work.   What tests do I need? Blood tests  Lipid and cholesterol levels. These may be checked every 5 years, or more often if you are over 60 years old.  Hepatitis C test.  Hepatitis B test. Screening  Lung cancer screening. You may have this screening every year starting at age 60 if you have a 30-pack-year history of smoking and currently smoke or have quit within the past 15 years.  Colorectal cancer screening. ? All adults should have this screening starting at age 60 and continuing until age 82. ? Your health care provider may recommend screening at age 60 if you are at increased risk. ? You will have tests every 1-10 years, depending on your results and the type of screening test.  Diabetes screening. ? This is done by checking your blood sugar (glucose) after you have not eaten for a while (fasting). ? You may have this done every 1-3 years.  Mammogram. ? This may be done every 1-2 years. ? Talk with your health care provider about when you should start having regular mammograms. This may depend on whether you have a family history of breast cancer.  BRCA-related cancer screening. This may be done if you have a family history of breast, ovarian, tubal, or peritoneal cancers.  Pelvic exam and Pap test. ? This may be done every 3 years starting at age 60. ? Starting at  age 60, this may be done every 5 years if you have a Pap test in combination with an HPV test. Other tests  STD (sexually transmitted disease) testing, if you are at risk.  Bone density scan. This is done to screen for osteoporosis. You may have this scan if you are at high risk for osteoporosis. Talk with your health care provider about your test results, treatment options, and if necessary, the need for more tests. Follow these instructions at home: Eating  and drinking  Eat a diet that includes fresh fruits and vegetables, whole grains, lean protein, and low-fat dairy products.  Take vitamin and mineral supplements as recommended by your health care provider.  Do not drink alcohol if: ? Your health care provider tells you not to drink. ? You are pregnant, may be pregnant, or are planning to become pregnant.  If you drink alcohol: ? Limit how much you have to 0-1 drink a day. ? Be aware of how much alcohol is in your drink. In the [place] one drink equals one 12 oz bottle of beer (355 mL), one 5 oz glass of wine (148 mL), or one 1 oz glass of hard liquor (44 mL).   Lifestyle  Take daily care of your teeth and gums. Brush your teeth every morning and night with fluoride toothpaste. Floss one time each day.  Stay active. Exercise for at least 30 minutes 5 or more days each week.  Do not use any products that contain nicotine or tobacco, such as cigarettes, e-cigarettes, and chewing tobacco. If you need help quitting, ask your health care provider.  Do not use drugs.  If you are sexually active, practice safe sex. Use a condom or other form of protection to prevent STIs (sexually transmitted infections).  If you do not wish to become pregnant, use a form of birth control. If you plan to become pregnant, see your health care provider for a prepregnancy visit.  If told by your health care provider, take low-dose aspirin daily starting at age 50.  Find healthy ways to cope with stress, such as: ? Meditation, yoga, or listening to music. ? Journaling. ? Talking to a trusted person. ? Spending time with friends and family. Safety  Always wear your seat belt while driving or riding in a vehicle.  Do not drive: ? If you have been drinking alcohol. Do not ride with someone who has been drinking. ? When you are tired or distracted. ? While texting.  Wear a helmet and other protective equipment during sports activities.  If you have  firearms in your house, make sure you follow all gun safety procedures. What's next?  Visit your health care provider once a year for an annual wellness visit.  Ask your health care provider how often you should have your eyes and teeth checked.  Stay up to date on all vaccines. This information is not intended to replace advice given to you by your health care provider. Make sure you discuss any questions you have with your health care provider. Document Revised: 12/28/2019 Document Reviewed: 12/04/2017 Elsevier Patient Education  2021 [place].    Preventing Osteoporosis, Adult Osteoporosis is a condition that causes the bones to lose density. This means that the bones become thinner, and the normal spaces in bone tissue become larger. Low bone density can make the bones weak and cause them to break more easily. Osteoporosis cannot always be prevented, but you can take steps to lower your risk of developing  this condition. How can this condition affect me? If you develop osteoporosis, you will be more likely to break bones in your wrist, spine, or hip. Even a minor accident or injury can be enough to break weak bones. The bones will also be slower to heal. Osteoporosis can cause other problems as well, such as a stooped posture or trouble with movement. Osteoporosis can occur with aging. As you get older, you may lose bone tissue more quickly, or it may be replaced more slowly. Osteoporosis is more likely to develop if you have poor nutrition or do not get enough calcium or vitamin D. Other lifestyle factors can also play a role. By eating a well-balanced diet and making lifestyle changes, you can help keep your bones strong and healthy, lowering your chances of developing osteoporosis. What can increase my risk? The following factors may make you more likely to develop osteoporosis:  Having a family history of the condition.  Having poor nutrition or not getting enough calcium or  vitamin D.  Using certain medicines, such as steroid medicines or anti-seizure medicines.  Being any of the following: ? 51 years of age or older. ? Female. ? A woman who has gone through menopause (is postmenopausal). ? A person who is of European or Asian descent.  Using products that contain nicotine or tobacco, such as cigarettes, e-cigarettes, and chewing tobacco.  Not being physically active (being sedentary).  Having a small body frame. What actions can I take to prevent this? Get enough calcium  Make sure you get enough calcium every day. Calcium is the most important mineral for bone health. Most people can get enough calcium from their diet, but supplements may be recommended for people who are at risk for osteoporosis. Follow these guidelines: ? If you are age 56 or younger, aim to get 1,000 milligrams (mg) of calcium every day. ? If you are older than age 39, aim to get 1,200 mg of calcium every day.  Good sources of calcium include: ? Dairy products, such as low-fat or nonfat milk, cheese, and yogurt. ? Dark green leafy vegetables, such as bok choy and broccoli. ? Foods that have had calcium added to them (calcium-fortified foods), such as orange juice, cereal, bread, soy beverages, and tofu products. ? Nuts, such as almonds.  Check nutrition labels to see how much calcium is in a food or drink.   Get enough vitamin D  Try to get enough vitamin D every day. Vitamin D is the most essential vitamin for bone health. It helps the body absorb calcium. Follow these guidelines for how much vitamin D to get from food: ? If you are age 33 or younger, aim to get at least 600 international units (IU) every day. Your health care provider may suggest more. ? If you are older than age 22, aim to get at least 800 international units every day. Your health care provider may suggest more.  Good sources of vitamin D in your diet include: ? Egg yolks. ? Oily fish, such as salmon,  sardines, and tuna. ? Milk and cereal fortified with vitamin D.  Your body also makes vitamin D when you are out in the sun. Exposing the bare skin on your face, arms, legs, or back to the sun for no more than 30 minutes a day, 2 times a week is more than enough. Beyond that, make sure you use sunblock to protect your skin from sunburn, which increases your risk for skin cancer. Exercise  Stay active and get exercise every day.  Ask your health care provider what types of exercise are best for you. Weight-bearing and strength-building activities are important for building and maintaining healthy bones. Some examples of these types of activities include: ? Walking and hiking. ? Jogging and running. ? Dancing. ? Gym exercises and lifting weights. ? Tennis and racquetball. ? Climbing stairs. ? Tai chi.   Make other lifestyle changes  Do not use any products that contain nicotine or tobacco, such as cigarettes, e-cigarettes, and chewing tobacco. If you need help quitting, ask your health care provider.  Lose weight if you are overweight.  If you drink alcohol: ? Limit how much you use to:  0-1 drink a day for women who are not pregnant.  0-2 drinks a day for men. ? Be aware of how much alcohol is in your drink. In the [place] one drink equals one 12 oz bottle of beer (355 mL), one 5 oz glass of wine (148 mL), or one 1 oz glass of hard liquor (44 mL). Where to find support If you need help making changes to prevent osteoporosis, talk with your health care provider. You can ask for a referral to a dietitian and a physical therapist. Where to find more information Learn more about osteoporosis from:  NIH Osteoporosis and Related [place]: www.bones.SouthExposed.es  [place] Office on [employer] Health: [place].tn  [place]: EquipmentWeekly.com.ee Summary  Osteoporosis is a condition that causes weak bones that are more likely to break.  Eat a  healthy diet, making sure you get enough calcium and vitamin D, and stay active by getting regular exercise to help prevent osteoporosis.  Other ways to reduce your risk of osteoporosis include maintaining a healthy weight and avoiding alcohol and products that contain nicotine or tobacco. This information is not intended to replace advice given to you by your health care provider. Make sure you discuss any questions you have with your health care provider. Document Revised: 09/09/2019 Document Reviewed: 09/09/2019 Elsevier Patient Education  [place].

## 2020-05-31 NOTE — Progress Notes (Signed)
Patient: Amber Murray, Female    DOB: 06/03/1960, 60 y.o.   MRN: [id number] Delsa Grana, PA-C Visit Date: 05/31/2020  Today's Provider: Delsa Grana, PA-C   Chief Complaint  Patient presents with  . Annual Exam   Subjective:   Annual physical exam:  Amber Murray is a 60 y.o. female who presents today for complete physical exam:  Exercise/Activity:   More than 10K steps daily, very active Diet/nutrition:  Working on healthy diet Sleep:  No probs, sleeps well  USPSTF grade A and B recommendations - reviewed and addressed today  Depression:  Phq 9 completed today by patient, was reviewed by me with patient in the room PHQ score is neg, pt feels good PHQ 2/9 Scores 05/31/2020 05/18/2019 06/09/2018 04/29/2018  PHQ - 2 Score 0 0 0 0  PHQ- 9 Score 0 0 0 0   Depression screen [facility] 2/9 05/31/2020 05/18/2019 06/09/2018 04/29/2018 03/24/2017  Decreased Interest 0 0 0 0 0  Down, Depressed, Hopeless 0 0 0 0 0  PHQ - 2 Score 0 0 0 0 0  Altered sleeping 0 0 0 0 -  Tired, decreased energy 0 0 0 0 -  Change in appetite 0 0 0 0 -  Feeling bad or failure about yourself  0 0 0 0 -  Trouble concentrating 0 0 0 0 -  Moving slowly or fidgety/restless 0 0 0 0 -  Suicidal thoughts 0 0 0 0 -  PHQ-9 Score 0 0 0 0 -  Difficult doing work/chores - Not difficult at all Not difficult at all Not difficult at all -    Alcohol screening: [place] Office Visit from 05/31/2020 in [facility]  AUDIT-C Score 0      Immunizations and Health Maintenance: Health Maintenance  Topic Date Due  . COVID-19 Vaccine (3 - Booster for Pfizer series) 02/02/2020  . PAP SMEAR-Modifier  03/24/2020  . MAMMOGRAM  05/02/2020  . INFLUENZA VACCINE  07/06/2020 (Originally 11/07/2019)  . COLONOSCOPY (Pts 45-54yrs Insurance coverage will need to be confirmed)  06/15/2020  . TETANUS/TDAP  04/27/2025  . Hepatitis C Screening  Completed  . HIV Screening  Completed     Hep C Screening:    STD testing and prevention (HIV/chl/gon/syphilis):  see above, no additional testing desired by pt today  Intimate partner violence:    Sexual History/Pain during Intercourse: Married  Menstrual History/LMP/Abnormal Bleeding:   None, no pain No LMP recorded. Patient is postmenopausal.  Menopause 49   Incontinence Symptoms:  Mild stress incontinence   Breast cancer: ordered due - pt has order and scheduling info Last Mammogram: *see HM list above BRCA gene screening: pt does not believe family hx was BRCA positive   Cervical cancer screening: due Sister and niece have hx of breast CA, brother with CRC Pt denies family hx of cancers -  ovarian, uterine  Osteoporosis:   Discussion on osteoporosis per age, including high calcium and vitamin D supplementation, weight bearing exercises Pt is doing supplementing with daily Vit D No calcium   Skin cancer:  Hx of skin CA -  NO Discussed atypical lesions   Colorectal cancer:   Colonoscopy is due next month  Discussed concerning signs and sx of CRC, pt denies melena, hematochezia  Lung cancer:   Low Dose CT Chest recommended if Age 51-80 years, 30 pack-year currently smoking OR have quit w/in 15years. Patient does not qualify.    Social History   Tobacco Use  .  Smoking status: Former Smoker    Quit date: 04/27/2005    Years since quitting: 15.1  . Smokeless tobacco: Never Used  Vaping Use  . Vaping Use: Never used  Substance Use Topics  . Alcohol use: Yes    Alcohol/week: 2.0 standard drinks    Types: 2 Shots of liquor per week  . Drug use: Yes    Types: Marijuana    Comment: 1x/mo - last time 06/09/15     Flowsheet Row Office Visit from 05/31/2020 in [facility]  AUDIT-C Score 0      Family History  Problem Relation Age of Onset  . Kidney disease Mother   . Hypertension Mother   . Cancer Sister        breast  . Breast cancer Sister 34  . Cancer Brother        colon  . Kidney disease Brother    . Hypertension Father   . Cerebral palsy Brother   . Colon cancer Brother   . HIV/AIDS Brother   . Cancer Other   . Breast cancer Other      Blood pressure/Hypertension: BP Readings from Last 3 Encounters:  05/31/20 126/84  05/18/19 132/86  06/09/18 110/74    Weight/Obesity: Wt Readings from Last 3 Encounters:  05/31/20 267 lb 4.8 oz (121.2 kg)  05/18/19 261 lb 1.6 oz (118.4 kg)  06/09/18 276 lb 1.6 oz (125.2 kg)   BMI Readings from Last 3 Encounters:  05/31/20 43.14 kg/m  05/18/19 42.14 kg/m  06/09/18 45.95 kg/m     Lipids:  Lab Results  Component Value Date   CHOL 216 (H) 05/18/2019   CHOL 179 04/29/2018   CHOL 172 03/10/2017   Lab Results  Component Value Date   HDL 63 05/18/2019   HDL 52 04/29/2018   HDL 51 03/10/2017   Lab Results  Component Value Date   LDLCALC 132 (H) 05/18/2019   LDLCALC 110 (H) 04/29/2018   LDLCALC 104 (H) 03/10/2017   Lab Results  Component Value Date   TRIG 107 05/18/2019   TRIG 80 04/29/2018   TRIG 78 03/10/2017   Lab Results  Component Value Date   CHOLHDL 3.4 05/18/2019   CHOLHDL 3.4 04/29/2018   CHOLHDL 3.4 03/10/2017   No results found for: LDLDIRECT Based on the results of lipid panel his/her cardiovascular risk factor ( using [place] )  in the next 10 years is: The 10-year ASCVD risk score Mikey Bussing DC Brooke Bonito., et al., 2013) is: 6%   Values used to calculate the score:     Age: 70 years     Sex: Female     Is Non-Hispanic African American: Yes     Diabetic: No     Tobacco smoker: No     Systolic Blood Pressure: 154 mmHg     Is BP treated: Yes     HDL Cholesterol: 63 mg/dL     Total Cholesterol: 216 mg/dL Glucose:  Glucose, Bld  Date Value Ref Range Status  05/18/2019 88 65 - 99 mg/dL Final    Comment:    .            Fasting reference interval .   04/29/2018 91 65 - 99 mg/dL Final    Comment:    .            Fasting reference interval .   03/10/2017 105 (H) 65 - 99 mg/dL Final    Comment:     .  Fasting reference interval . For someone without known diabetes, a glucose value between 100 and 125 mg/dL is consistent with prediabetes and should be confirmed with a follow-up test. .     Social History      She        Social History   Socioeconomic History  . Marital status: Married    Spouse name: Jenny Reichmann  . Number of children: 3  . Years of education: 21  . Highest education level: Some college, no degree  Occupational History  . Not on file  Tobacco Use  . Smoking status: Former Smoker    Quit date: 04/27/2005    Years since quitting: 15.1  . Smokeless tobacco: Never Used  Vaping Use  . Vaping Use: Never used  Substance and Sexual Activity  . Alcohol use: Yes    Alcohol/week: 2.0 standard drinks    Types: 2 Shots of liquor per week  . Drug use: Yes    Types: Marijuana    Comment: 1x/mo - last time 06/09/15  . Sexual activity: Yes    Birth control/protection: Post-menopausal  Other Topics Concern  . Not on file  Social History Narrative  . Not on file   Social Determinants of Health   Financial Resource Strain: Low Risk   . Difficulty of Paying Living Expenses: Not hard at all  Food Insecurity: No Food Insecurity  . Worried About Charity fundraiser in the Last Year: Never true  . Ran Out of Food in the Last Year: Never true  Transportation Needs: No Transportation Needs  . Lack of Transportation (Medical): No  . Lack of Transportation (Non-Medical): No  Physical Activity: Sufficiently Active  . Days of Exercise per Week: 5 days  . Minutes of Exercise per Session: 40 min  Stress: No Stress Concern Present  . Feeling of Stress : Not at all  Social Connections: Socially Integrated  . Frequency of Communication with Friends and Family: More than three times a week  . Frequency of Social Gatherings with Friends and Family: Once a week  . Attends Religious Services: 1 to 4 times per year  . Active Member of Clubs or Organizations: Yes  .  Attends Archivist Meetings: 1 to 4 times per year  . Marital Status: Married    Family History        Family History  Problem Relation Age of Onset  . Kidney disease Mother   . Hypertension Mother   . Cancer Sister        breast  . Breast cancer Sister 52  . Cancer Brother        colon  . Kidney disease Brother   . Hypertension Father   . Cerebral palsy Brother   . Colon cancer Brother   . HIV/AIDS Brother   . Cancer Other   . Breast cancer Other     Patient Active Problem List   Diagnosis Date Noted  . Preventative health care 04/29/2018  . Leg cramps 02/17/2017  . Encounter for medication monitoring 12/18/2015  . Essential hypertension, benign 11/14/2015  . Shortness of breath 11/14/2015  . Prediabetes 11/13/2015  . Medication monitoring encounter 11/13/2015  . Special screening for malignant neoplasms, colon   . Benign neoplasm of rectosigmoid junction   . Family history of breast cancer 04/28/2015  . Family history of colon cancer 04/28/2015  . Right knee pain 04/28/2015  . Obesity, Class III, BMI 40-49.9 (morbid obesity) ([place]) 04/28/2015  . Screening for  STD (sexually transmitted disease) 04/28/2015  . Encounter for screening mammogram for malignant neoplasm of breast 04/28/2015  . Need for diphtheria-tetanus-pertussis (Tdap) vaccine, adult/adolescent 04/28/2015    Past Surgical History:  Procedure Laterality Date  . CHOLECYSTECTOMY    . COLONOSCOPY WITH PROPOFOL N/A 06/16/2015   Procedure: COLONOSCOPY WITH PROPOFOL;  Surgeon: Lucilla Lame, MD;  Location: [place];  Service: Endoscopy;  Laterality: N/A;  . POLYPECTOMY  06/16/2015   Procedure: POLYPECTOMY INTESTINAL;  Surgeon: Lucilla Lame, MD;  Location: [place];  Service: Endoscopy;;  Sigmoid colon polyp     Current Outpatient Medications:  .  aspirin EC 81 MG tablet, Take 81 mg by mouth daily., Disp: , Rfl:  .  Cholecalciferol (VITAMIN D3 PO), Take by mouth., Disp: , Rfl:   .  Cyanocobalamin (VITAMIN B-12 PO), Take 1,000 mcg by mouth 2 (two) times daily., Disp: , Rfl:  .  meloxicam (MOBIC) 15 MG tablet, Take 1 tablet (15 mg total) by mouth as needed., Disp: 30 tablet, Rfl: 3 .  lisinopril-hydrochlorothiazide (ZESTORETIC) 20-12.5 MG tablet, Take 1 tablet by mouth daily. (Patient not taking: Reported on 05/31/2020), Disp: 90 tablet, Rfl: 3  No Known Allergies  Patient Care Team: Delsa Grana, PA-C as PCP - General (Family Medicine)  Review of Systems  Constitutional: Negative.   HENT: Negative.   Eyes: Negative.   Respiratory: Negative.   Cardiovascular: Negative.   Gastrointestinal: Negative.   Endocrine: Negative.   Genitourinary: Negative.   Musculoskeletal: Negative.   Skin: Negative.   Allergic/Immunologic: Negative.   Neurological: Negative.   Hematological: Negative.   Psychiatric/Behavioral: Negative.   All other systems reviewed and are negative.    I personally reviewed active problem list, medication list, allergies, family history, social history, health maintenance, notes from last encounter, lab results, imaging with the patient/caregiver today.        Objective:   Vitals:  Vitals:   05/31/20 1022  BP: 126/84  Pulse: 98  Resp: 18  Temp: 98.2 F (36.8 C)  SpO2: 99%  Weight: 267 lb 4.8 oz (121.2 kg)  Height: $Remove'5\' 6"'JHUWfeo$  (1.676 m)    Body mass index is 43.14 kg/m.  Physical Exam Vitals and nursing note reviewed. Exam conducted with a chaperone present.  Constitutional:      General: She is not in acute distress.    Appearance: Normal appearance. She is well-developed. She is obese. She is not ill-appearing, toxic-appearing or diaphoretic.     Interventions: Face mask in place.  HENT:     Head: Normocephalic and atraumatic.     Right Ear: External ear normal.     Left Ear: External ear normal.  Eyes:     General: Lids are normal. No scleral icterus.       Right eye: No discharge.        Left eye: No discharge.      Conjunctiva/sclera: Conjunctivae normal.  Neck:     Trachea: Phonation normal. No tracheal deviation.  Cardiovascular:     Rate and Rhythm: Normal rate and regular rhythm.     Pulses: Normal pulses.          Radial pulses are 2+ on the right side and 2+ on the left side.       Posterior tibial pulses are 2+ on the right side and 2+ on the left side.     Heart sounds: Normal heart sounds. No murmur heard. No friction rub. No gallop.   Pulmonary:  Effort: Pulmonary effort is normal. No respiratory distress.     Breath sounds: Normal breath sounds. No stridor. No wheezing, rhonchi or rales.  Chest:     Chest wall: No mass, deformity, swelling, tenderness, crepitus or edema.  Breasts: Breasts are symmetrical.     Right: Normal. No swelling, bleeding, inverted nipple, mass, nipple discharge, skin change, tenderness, axillary adenopathy or supraclavicular adenopathy.     Left: Normal. No swelling, bleeding, inverted nipple, mass, nipple discharge, skin change, tenderness, axillary adenopathy or supraclavicular adenopathy.    Abdominal:     General: Bowel sounds are normal. There is no distension.     Palpations: Abdomen is soft.  Genitourinary:    General: Normal vulva.     Urethra: No prolapse.     Vagina: Normal.     Cervix: No cervical motion tenderness, friability, lesion, erythema, cervical bleeding or eversion.     Uterus: Normal.      Adnexa: Right adnexa normal and left adnexa normal.     Comments: Moderate amount of white discharge some thicker particulates Musculoskeletal:     Right lower leg: No edema.     Left lower leg: No edema.  Lymphadenopathy:     Upper Body:     Right upper body: No supraclavicular, axillary or pectoral adenopathy.     Left upper body: No supraclavicular, axillary or pectoral adenopathy.  Skin:    General: Skin is warm and dry.     Coloration: Skin is not jaundiced or pale.     Findings: No rash.  Neurological:     Mental Status: She is  alert.     Motor: No abnormal muscle tone.     Gait: Gait normal.  Psychiatric:        Mood and Affect: Mood normal.        Speech: Speech normal.        Behavior: Behavior normal.       Fall Risk: Fall Risk  05/31/2020 05/18/2019 06/09/2018 04/29/2018 03/24/2017  Falls in the past year? 0 0 0 0 No  Number falls in past yr: 0 0 0 0 -  Injury with Fall? 0 0 0 0 -  Follow up - - Falls evaluation completed - -    Functional Status Survey: Is the patient deaf or have difficulty hearing?: No Does the patient have difficulty seeing, even when wearing glasses/contacts?: No Does the patient have difficulty concentrating, remembering, or making decisions?: No Does the patient have difficulty walking or climbing stairs?: No Does the patient have difficulty dressing or bathing?: No Does the patient have difficulty doing errands alone such as visiting a doctor's office or shopping?: No   Assessment & Plan:    CPE completed today  . USPSTF grade A and B recommendations reviewed with patient; age-appropriate recommendations, preventive care, screening tests, etc discussed and encouraged; healthy living encouraged; see AVS for patient education given to patient  . Discussed importance of 150 minutes of physical activity weekly, AHA exercise recommendations given to pt in AVS/handout  . Discussed importance of healthy diet:  eating lean meats and proteins, avoiding trans fats and saturated fats, avoid simple sugars and excessive carbs in diet, eat 6 servings of fruit/vegetables daily and drink plenty of water and avoid sweet beverages.    . Recommended pt to do annual eye exam and routine dental exams/cleanings  . Depression, alcohol, fall screening completed as documented above and per flowsheets  . Reviewed Health Maintenance: Health Maintenance  Topic Date Due  .  COVID-19 Vaccine (3 - Booster for Pfizer series) 02/02/2020  . PAP SMEAR-Modifier  03/24/2020  . MAMMOGRAM  05/02/2020  .  INFLUENZA VACCINE  07/06/2020 (Originally 11/07/2019)  . COLONOSCOPY (Pts 45-80yrs Insurance coverage will need to be confirmed)  06/15/2020  . TETANUS/TDAP  04/27/2025  . Hepatitis C Screening  Completed  . HIV Screening  Completed    . Immunizations: Immunization History  Administered Date(s) Administered  . PFIZER(Purple Top)SARS-COV-2 Vaccination 07/09/2019, 08/03/2019  . Tdap 04/28/2015      ICD-10-CM   1. Adult general medical exam  Z00.00 Lipid panel    COMPLETE METABOLIC PANEL WITH GFR    CBC w/Diff/Platelet    Hemoglobin A1C    Cytology - PAP  2. Essential hypertension, benign  V42 COMPLETE METABOLIC PANEL WITH GFR   pt stopped BP meds because BP was too low ~110/59, BP at goal today w/o meds, continue to monitor - suggested restart 1/2 pill if BP >130/80  3. Screening for cervical cancer  Z12.4 Cytology - PAP  4. Hyperlipidemia, unspecified hyperlipidemia type  E78.5 Lipid panel    COMPLETE METABOLIC PANEL WITH GFR   elevated, not on meds, very active, encouraged healthy diet habits - will recheck lipids and reassess risk, f/up pending labs  5. Prediabetes  V95.63 COMPLETE METABOLIC PANEL WITH GFR    Hemoglobin A1C   no family hx  6. Encounter for screening mammogram for breast cancer  Z12.31   7. Screening for colon cancer  Z12.11 Ambulatory referral to Gastroenterology   5 year f/up with Dr. Allen Norris - brother died of CRC  60. Postmenopausal estrogen deficiency  Z78.0 DG Bone Density   menopause over 10 years ago  16. Bilateral foot pain  M79.671 meloxicam (MOBIC) 15 MG tablet   M79.672    intermittent plantar fasciitis symptoms she requests refill on Mobic which she uses very sparingly maybe 1-2 times a week  10. Urgency of urination  R39.15 POCT urinalysis dipstick   screen urine, suspect if may be due to yeast  11. Yeast vaginitis  B37.3 fluconazole (DIFLUCAN) 150 MG tablet  12. Obesity, Class III, BMI 40-49.9 (morbid obesity) (HCC) Chronic E66.01    discussed meds  than may help with prediabetes, possible metabolic syndrome/dysfunction   Results for orders placed or performed in visit on 05/31/20  POCT urinalysis dipstick  Result Value Ref Range   Color, UA yellow    Clarity, UA clear    Glucose, UA Negative Negative   Bilirubin, UA neg    Ketones, UA neg    Spec Grav, UA 1.015 1.010 - 1.025   Blood, UA neg    pH, UA 5.0 5.0 - 8.0   Protein, UA Negative Negative   Urobilinogen, UA 0.2 0.2 or 1.0 E.U./dL   Nitrite, UA neg    Leukocytes, UA Negative Negative   Appearance clear    Odor normal    UA unremarkable - treat yeast vaginitis with diflucan, pt encouraged to f/up if not improving   Delsa Grana, PA-C 05/31/20 10:39 AM  [facility]

## 2020-06-01 LAB — COMPLETE METABOLIC PANEL WITH GFR
AG Ratio: 1.6 (calc) (ref 1.0–2.5)
ALT: 14 U/L (ref 6–29)
AST: 15 U/L (ref 10–35)
Albumin: 4.1 g/dL (ref 3.6–5.1)
Alkaline phosphatase (APISO): 84 U/L (ref 37–153)
BUN: 15 mg/dL (ref 7–25)
CO2: 26 mmol/L (ref 20–32)
Calcium: 9.4 mg/dL (ref 8.6–10.4)
Chloride: 105 mmol/L (ref 98–110)
Creat: 0.65 mg/dL (ref 0.50–1.05)
GFR, Est African American: 113 mL/min/{1.73_m2} (ref >=60)
GFR, Est Non African American: 97 mL/min/{1.73_m2} (ref >=60)
Globulin: 2.6 g/dL (calc) (ref 1.9–3.7)
Glucose, Bld: 89 mg/dL (ref 65–99)
Potassium: 4.3 mmol/L (ref 3.5–5.3)
Sodium: 139 mmol/L (ref 135–146)
Total Bilirubin: 0.3 mg/dL (ref 0.2–1.2)
Total Protein: 6.7 g/dL (ref 6.1–8.1)

## 2020-06-01 LAB — LIPID PANEL
Cholesterol: 192 mg/dL (ref <200)
HDL: 63 mg/dL (ref >=50)
LDL Cholesterol (Calc): 113 mg/dL (calc) — ABNORMAL HIGH
Non-HDL Cholesterol (Calc): 129 mg/dL (calc) (ref <130)
Total CHOL/HDL Ratio: 3 (calc) (ref <5.0)
Triglycerides: 70 mg/dL (ref <150)

## 2020-06-01 LAB — CBC WITH DIFFERENTIAL/PLATELET
Absolute Monocytes: 328 cells/uL (ref 200–950)
Basophils Absolute: 40 cells/uL (ref 0–200)
Basophils Relative: 1 %
Eosinophils Absolute: 188 cells/uL (ref 15–500)
Eosinophils Relative: 4.7 %
HCT: 40 % (ref 35.0–45.0)
Hemoglobin: 13.5 g/dL (ref 11.7–15.5)
Lymphs Abs: 1760 cells/uL (ref 850–3900)
MCH: 30.3 pg (ref 27.0–33.0)
MCHC: 33.8 g/dL (ref 32.0–36.0)
MCV: 89.9 fL (ref 80.0–100.0)
MPV: 9.9 fL (ref 7.5–12.5)
Monocytes Relative: 8.2 %
Neutro Abs: 1684 cells/uL (ref 1500–7800)
Neutrophils Relative %: 42.1 %
Platelets: 256 10*3/uL (ref 140–400)
RBC: 4.45 10*6/uL (ref 3.80–5.10)
RDW: 12.3 % (ref 11.0–15.0)
Total Lymphocyte: 44 %
WBC: 4 10*3/uL (ref 3.8–10.8)

## 2020-06-01 LAB — CYTOLOGY - PAP
Comment: NEGATIVE
Diagnosis: NEGATIVE
High risk HPV: NEGATIVE

## 2020-06-01 LAB — HEMOGLOBIN A1C
Hgb A1c MFr Bld: 5.5 % of total Hgb (ref <5.7)
Mean Plasma Glucose: 111 mg/dL
eAG (mmol/L): 6.2 mmol/L

## 2020-06-12 ENCOUNTER — Encounter: Payer: Self-pay | Admitting: *Deleted

## 2020-07-26 ENCOUNTER — Ambulatory Visit
Admission: RE | Admit: 2020-07-26 | Discharge: 2020-07-26 | Disposition: A | Discharge status: Home / Self Care | Payer: 59 | Source: Ambulatory Visit | Attending: Family Medicine | Admitting: Family Medicine

## 2020-07-26 ENCOUNTER — Other Ambulatory Visit: Payer: Self-pay

## 2020-07-26 DIAGNOSIS — Z1231 Encounter for screening mammogram for malignant neoplasm of breast: Secondary | ICD-10-CM | POA: Diagnosis present

## 2020-08-15 ENCOUNTER — Telehealth: Payer: Self-pay

## 2020-08-15 NOTE — Telephone Encounter (Signed)
Virtual appt scheduled with Kristeen Miss for tomorrow afternoon Copied from [place] [phone]. Topic: General - Other >> Aug 14, 2020  8:05 AM Leward Quan A wrote: Reason for CRM: Patient called in to be seen she tested positive for Covid on 08/13/20 with an at home test had body ache and pain, diarrhea,headache,head congestion,little fever and other flu like symptoms. Wanted a virtual visit will need a note to return to work. Please call patient at  Ph# [phone]

## 2020-08-16 ENCOUNTER — Encounter: Payer: Self-pay | Admitting: Family Medicine

## 2020-08-16 ENCOUNTER — Ambulatory Visit (INDEPENDENT_AMBULATORY_CARE_PROVIDER_SITE_OTHER): Payer: 59 | Admitting: Family Medicine

## 2020-08-16 VITALS — BP 118/76 | HR 86 | Ht 66.0 in | Wt 260.0 lb

## 2020-08-16 DIAGNOSIS — U071 COVID-19: Secondary | ICD-10-CM | POA: Diagnosis not present

## 2020-08-16 DIAGNOSIS — J069 Acute upper respiratory infection, unspecified: Secondary | ICD-10-CM

## 2020-08-16 NOTE — Progress Notes (Signed)
Name: Amber Murray   MRN: [id number]    DOB: January 03, 1961   Date:08/16/2020       Progress Note  Subjective:    Chief Complaint  Chief Complaint  Patient presents with  . Covid Exposure    Positive needs doctors excuse    I connected with  Mauricia Area on 08/16/20 at  3:40 PM EDT by telephone and verified that I am speaking with the correct person using two identifiers.   I discussed the limitations, risks, security and privacy concerns of performing an evaluation and management service by telephone and the availability of in person appointments. Staff also discussed with the patient that there may be a patient responsible charge related to this service.  Patient verbalized understanding and agreed to proceed with encounter. Patient Location: home Provider Location: [facility] Additional Individuals present: none  HPI Pt presents needing work note for being COVID positive and has been isolating.  Sx started last Thursday 5/5 to 5/6, consisted of  Fatigue diarrhea, stuffiness - almost all sx have improved, she is fever free, denies cough, SOB, sweats, she  feels like she is already over a bad cold  Needs RTW note   Patient Active Problem List   Diagnosis Date Noted  . Preventative health care 04/29/2018  . Leg cramps 02/17/2017  . Encounter for medication monitoring 12/18/2015  . Essential hypertension, benign 11/14/2015  . Shortness of breath 11/14/2015  . Prediabetes 11/13/2015  . Medication monitoring encounter 11/13/2015  . Special screening for malignant neoplasms, colon   . Benign neoplasm of rectosigmoid junction   . Family history of breast cancer 04/28/2015  . Family history of colon cancer 04/28/2015  . Right knee pain 04/28/2015  . Obesity, Class III, BMI 40-49.9 (morbid obesity) ([place]) 04/28/2015  . Screening for STD (sexually transmitted disease) 04/28/2015  . Encounter for screening mammogram for malignant neoplasm of breast 04/28/2015  .  Need for diphtheria-tetanus-pertussis (Tdap) vaccine, adult/adolescent 04/28/2015    Social History   Tobacco Use  . Smoking status: Former Smoker    Quit date: 04/27/2005    Years since quitting: 15.3  . Smokeless tobacco: Never Used  Substance Use Topics  . Alcohol use: Yes    Alcohol/week: 2.0 standard drinks    Types: 2 Shots of liquor per week     Current Outpatient Medications:  .  aspirin EC 81 MG tablet, Take 81 mg by mouth daily., Disp: , Rfl:  .  Cholecalciferol (VITAMIN D3 PO), Take by mouth., Disp: , Rfl:  .  Cyanocobalamin (VITAMIN B-12 PO), Take 1,000 mcg by mouth 2 (two) times daily., Disp: , Rfl:  .  meloxicam (MOBIC) 15 MG tablet, Take 1 tablet (15 mg total) by mouth as needed., Disp: 30 tablet, Rfl: 3  No Known Allergies  Chart Review: I personally reviewed active problem list, medication list, allergies, family history, social history, health maintenance, notes from last encounter, lab results, imaging with the patient/caregiver today.   Review of Systems  Constitutional: Negative.   HENT: Negative.   Eyes: Negative.   Respiratory: Negative.   Cardiovascular: Negative.   Gastrointestinal: Negative.   Endocrine: Negative.   Genitourinary: Negative.   Musculoskeletal: Negative.   Skin: Negative.   Allergic/Immunologic: Negative.   Neurological: Negative.   Hematological: Negative.   Psychiatric/Behavioral: Negative.   All other systems reviewed and are negative.    Objective:    Virtual encounter, vitals limited, only able to obtain the following Today's Vitals  08/16/20 1512  BP: 118/76  Pulse: 86  Weight: 260 lb (117.9 kg)  Height: 5\' 6"  (1.676 m)   Body mass index is 41.97 kg/m. Nursing Note and Vital Signs reviewed.  Physical Exam Vitals and nursing note reviewed.  Pulmonary:     Effort: No respiratory distress.     Comments: No audible wheeze, stridor, distress, no coughing, able to speak in full and complete  sentences Neurological:     Mental Status: She is alert.  Psychiatric:        Mood and Affect: Mood normal.     PE limited by telephone encounter  No results found for this or any previous visit (from the past 72 hour(s)).  Assessment and Plan:     ICD-10-CM   1. Upper respiratory tract infection due to COVID-19 virus  U07.1    J06.9    COVID +, onset of Sx 5/5 or 5/6 with URI and viral GI sx mostly resolved.  RTW not for 08/21/2020 sent through mychart, continue supportive measures, resting, fluids, etc, red flags reviewed with pt, stipulated pt should not return to work after 10d if febrile or any new or worsening sx - encouraged f/up right away   -Red flags and when to present for emergency care or RTC including but not limited to new/worsening/un-resolving symptoms, reviewed with patient at time of visit. Follow up and care instructions discussed and provided in AVS. - I discussed the assessment and treatment plan with the patient. The patient was provided an opportunity to ask questions and all were answered. The patient agreed with the plan and demonstrated an understanding of the instructions.  - The patient was advised to call back or seek an in-person evaluation if the symptoms worsen or if the condition fails to improve as anticipated.  I provided 10 minutes of non-face-to-face time during this encounter.  Delsa Grana, PA-C 08/16/20 4:33 PM

## 2020-08-22 ENCOUNTER — Ambulatory Visit: Payer: Self-pay | Admitting: Unknown Physician Specialty

## 2020-11-28 ENCOUNTER — Ambulatory Visit: Payer: 59 | Admitting: Family Medicine

## 2020-12-05 ENCOUNTER — Ambulatory Visit: Payer: 59 | Admitting: Family Medicine

## 2020-12-05 ENCOUNTER — Telehealth: Payer: Self-pay

## 2020-12-05 NOTE — Telephone Encounter (Signed)
Spoke with patient to inquire about bone density exam, scheduling number provided as patient stated she had forgotten it. She was pleasant and had no questions at this time.

## 2020-12-19 ENCOUNTER — Other Ambulatory Visit: Payer: Self-pay

## 2020-12-19 ENCOUNTER — Encounter: Payer: Self-pay | Admitting: Family Medicine

## 2020-12-19 ENCOUNTER — Ambulatory Visit (INDEPENDENT_AMBULATORY_CARE_PROVIDER_SITE_OTHER): Payer: 59 | Admitting: Family Medicine

## 2020-12-19 ENCOUNTER — Ambulatory Visit: Payer: 59 | Admitting: Family Medicine

## 2020-12-19 VITALS — BP 134/82 | HR 92 | Temp 98.0°F | Resp 16 | Ht 66.0 in | Wt 262.9 lb

## 2020-12-19 DIAGNOSIS — Z1211 Encounter for screening for malignant neoplasm of colon: Secondary | ICD-10-CM

## 2020-12-19 DIAGNOSIS — I10 Essential (primary) hypertension: Secondary | ICD-10-CM | POA: Diagnosis not present

## 2020-12-19 MED ORDER — SEMAGLUTIDE(0.25 OR 0.5MG/DOS) 2 MG/1.5ML ~~LOC~~ SOPN: 0.2500 mg | PEN_INJECTOR | SUBCUTANEOUS | 0 refills | Status: DC

## 2020-12-19 NOTE — Assessment & Plan Note (Signed)
Contributing to prediabetes, HLD. Start ozempic for weight management in adjunct to diet/exercise, f/u in 4 weeks. Handout provided.

## 2020-12-19 NOTE — Progress Notes (Signed)
   SUBJECTIVE:   CHIEF COMPLAINT / HPI:   Hypertension: - Medications: none. Discontinued BP meds at last visit due to reported lows. - Compliance: n/a - Checking BP at home: yes, usually 120s at home - Denies any CP, vision changes, LE edema, medication SEs, or symptoms of hypotension  OBESITY - Meds: none - Previously on phentermine - Complications of obesity: prediabetes, HTN, HLD - Peak weight: 280lb - Weight loss to date: 18lb - Diet: 3 meals per day with snacks.  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    OBJECTIVE:   BP 134/82   Pulse 92   Temp 98 F (36.7 C)   Resp 16   Ht '5\' 6"'$  (1.676 m)   Wt 262 lb 14.4 oz (119.3 kg)   SpO2 97%   BMI 42.43 kg/m   Gen: well appearing, in NAD Card: Reg rate Lungs: Comfortable WOB on RA Ext: WWP   ASSESSMENT/PLAN:   Essential hypertension, benign At goal home readings, no changes.   Obesity, Class III, BMI 40-49.9 (morbid obesity) (HCC) Contributing to prediabetes, HLD. Start ozempic for weight management in adjunct to diet/exercise, f/u in 4 weeks. Handout provided.      Myles Gip, DO

## 2020-12-19 NOTE — Assessment & Plan Note (Signed)
At goal home readings, no changes.

## 2020-12-19 NOTE — Patient Instructions (Addendum)
It was great to see you!  Our plans for today:  - Start semaglutide 0.25mg weekly.  - Come back in 4 weeks for follow up.  Take care and seek immediate care sooner if you develop any concerns.   Dr. Rumball  Here is an example of what a healthy plate looks like:    ? Make half your plate fruits and vegetables.     ? Focus on whole fruits.     ? Vary your veggies.  ? Make half your grains whole grains. -     ? Look for the word "whole" at the beginning of the ingredients list    ? Some whole-grain ingredients include whole oats, whole-wheat flour,        whole-grain corn, whole-grain brown rice, and whole rye.  ? Move to low-fat and fat-free milk or yogurt.  ? Vary your protein routine. - Meat, fish, poultry (chicken, turkey), eggs, beans (kidney, pinto), dairy.  ? Drink and eat less sodium, saturated fat, and added sugars.  Look for opportunities to move your body throughout your day:  Never lie down when you can sit; never sit when you can stand; never stand when you can pace.  Moving your body throughout the day is just as important as the 30 or 60 minutes of exercise at the gym!  Get social Get active with your friends instead of going out to eat. Go for a hike, walk around the mall, or play an exercise-themed video game.   Move more at work Fit more activity into the workday. Stand during phone calls, use a printer farther from your desk, and get up to stretch each hour.    Do something new Develop a new skill to kick-start your motivation. Sign up for a class to learn how to salsa dance, surf, do tai chi, or play a sport.    Keep cool in the pool Don't like to sweat? Hit the local community pool for a swim, water polo, or water aerobics class to stay cool while exercising.    Stay on track Use a fitness tracker (FITBIT, Fitness Pal mobile app) to track your activity and provide motivation to reach your goals.   

## 2020-12-27 ENCOUNTER — Other Ambulatory Visit: Payer: Self-pay | Admitting: Family Medicine

## 2021-01-02 ENCOUNTER — Other Ambulatory Visit: Payer: Self-pay

## 2021-01-02 ENCOUNTER — Ambulatory Visit
Admission: RE | Admit: 2021-01-02 | Discharge: 2021-01-02 | Disposition: A | Discharge status: Home / Self Care | Payer: 59 | Source: Ambulatory Visit | Attending: Family Medicine | Admitting: Family Medicine

## 2021-01-02 DIAGNOSIS — Z780 Asymptomatic menopausal state: Secondary | ICD-10-CM | POA: Insufficient documentation

## 2021-01-16 ENCOUNTER — Ambulatory Visit: Payer: 59 | Admitting: Family Medicine

## 2021-02-09 ENCOUNTER — Other Ambulatory Visit: Payer: Self-pay

## 2021-02-09 DIAGNOSIS — M79671 Pain in right foot: Secondary | ICD-10-CM

## 2021-02-09 MED ORDER — MELOXICAM 15 MG PO TABS: 15.0000 mg | ORAL_TABLET | ORAL | 3 refills | Status: DC | PRN

## 2021-02-09 NOTE — Telephone Encounter (Signed)
Patient came in office requesting a refill on meloxicam 15 mg to be sent to [facility] on [address].  Her last appt was on 12/19/2020.

## 2021-04-03 ENCOUNTER — Ambulatory Visit: Payer: 59 | Admitting: Nurse Practitioner

## 2021-04-19 ENCOUNTER — Telehealth: Payer: Self-pay

## 2021-04-19 NOTE — Telephone Encounter (Signed)
Pt had bone density on sept 27 and stated her insurance will not cover it because it is coded wrong. Stated is should have been coded as a preventative.

## 2021-04-20 ENCOUNTER — Encounter: Payer: Self-pay | Admitting: Family Medicine

## 2021-04-25 ENCOUNTER — Telehealth: Payer: Self-pay

## 2021-04-25 NOTE — Telephone Encounter (Signed)
Copied from [place] [id number]. Topic: General - Other >> Apr 25, 2021  3:28 PM Leward Quan A wrote: Reason for CRM: Nettie with [facility] called in to inform Delsa Grana that patient states the Bone Density done on 01/03/2021 was supposed to be for preventative but states that the codes used were for diagnostic so stated that if this is a mistake please update codes and contact patient with an update.

## 2021-09-13 ENCOUNTER — Ambulatory Visit (INDEPENDENT_AMBULATORY_CARE_PROVIDER_SITE_OTHER): Payer: 59 | Admitting: Family Medicine

## 2021-09-13 ENCOUNTER — Telehealth: Payer: Self-pay

## 2021-09-13 ENCOUNTER — Other Ambulatory Visit: Payer: Self-pay

## 2021-09-13 ENCOUNTER — Encounter: Payer: Self-pay | Admitting: Family Medicine

## 2021-09-13 VITALS — BP 152/96 | HR 77 | Temp 98.1°F | Resp 16 | Ht 64.0 in | Wt 267.7 lb

## 2021-09-13 DIAGNOSIS — R7303 Prediabetes: Secondary | ICD-10-CM | POA: Diagnosis not present

## 2021-09-13 DIAGNOSIS — I10 Essential (primary) hypertension: Secondary | ICD-10-CM

## 2021-09-13 DIAGNOSIS — Z23 Encounter for immunization: Secondary | ICD-10-CM

## 2021-09-13 DIAGNOSIS — Z800 Family history of malignant neoplasm of digestive organs: Secondary | ICD-10-CM

## 2021-09-13 DIAGNOSIS — E785 Hyperlipidemia, unspecified: Secondary | ICD-10-CM

## 2021-09-13 DIAGNOSIS — Z1211 Encounter for screening for malignant neoplasm of colon: Secondary | ICD-10-CM

## 2021-09-13 DIAGNOSIS — Z86010 Personal history of colonic polyps: Secondary | ICD-10-CM

## 2021-09-13 DIAGNOSIS — Z1231 Encounter for screening mammogram for malignant neoplasm of breast: Secondary | ICD-10-CM

## 2021-09-13 MED ORDER — SHINGRIX 50 MCG/0.5ML IM SUSR: 0.5000 mL | Freq: Once | INTRAMUSCULAR | 0 refills | Status: AC

## 2021-09-13 MED ORDER — NA SULFATE-K SULFATE-MG SULF 17.5-3.13-1.6 GM/177ML PO SOLN: 1.0000 | Freq: Once | ORAL | 0 refills | Status: AC

## 2021-09-13 MED ORDER — LISINOPRIL-HYDROCHLOROTHIAZIDE 20-12.5 MG PO TABS: 1.0000 | ORAL_TABLET | Freq: Every day | ORAL | 0 refills | Status: DC

## 2021-09-13 NOTE — Progress Notes (Signed)
Name: Amber Murray   MRN: [id number]    DOB: 1960/11/01   Date:09/13/2021       Progress Note  Chief Complaint  Patient presents with   Follow-up   Hypertension    X2 weeks BP been running high at home, pt states can feel some pressure on head.     Subjective:   Amber Murray is a 61 y.o. female, presents to clinic for htn   Hypertension:  Previously were able to take off meds, bp was getting too low Blood pressure today is elevated BP Readings from Last 3 Encounters:  09/13/21 (!) 152/96  12/19/20 134/82  08/16/20 118/76   Pt denies CP, SOB, exertional sx, LE edema, palpitation, visual disturbances, lightheadedness, hypotension, syncope. Some head pressure Previously on lisinopril-HCTZ  Some urinary urgency       Current Outpatient Medications:    aspirin EC 81 MG tablet, Take 81 mg by mouth daily., Disp: , Rfl:    Cholecalciferol (VITAMIN D3 PO), Take by mouth., Disp: , Rfl:    Cyanocobalamin (VITAMIN B-12 PO), Take 1,000 mcg by mouth 2 (two) times daily., Disp: , Rfl:    meloxicam (MOBIC) 15 MG tablet, Take 1 tablet (15 mg total) by mouth as needed., Disp: 30 tablet, Rfl: 3   Semaglutide,0.25 or 0.'5MG'$ /DOS, 2 MG/1.5ML SOPN, Inject 0.25 mg into the skin once a week. (Patient not taking: Reported on 09/13/2021), Disp: 1.5 mL, Rfl: 0  Patient Active Problem List   Diagnosis Date Noted   Preventative health care 04/29/2018   Leg cramps 02/17/2017   Encounter for medication monitoring 12/18/2015   Essential hypertension, benign 11/14/2015   Shortness of breath 11/14/2015   Prediabetes 11/13/2015   Medication monitoring encounter 11/13/2015   Special screening for malignant neoplasms, colon    Benign neoplasm of rectosigmoid junction    Family history of breast cancer 04/28/2015   Family history of colon cancer 04/28/2015   Right knee pain 04/28/2015   Obesity, Class III, BMI 40-49.9 (morbid obesity) ([place]) 04/28/2015   Screening for STD (sexually  transmitted disease) 04/28/2015   Encounter for screening mammogram for malignant neoplasm of breast 04/28/2015   Need for diphtheria-tetanus-pertussis (Tdap) vaccine, adult/adolescent 04/28/2015    Past Surgical History:  Procedure Laterality Date   CHOLECYSTECTOMY     COLONOSCOPY WITH PROPOFOL N/A 06/16/2015   Procedure: COLONOSCOPY WITH PROPOFOL;  Surgeon: Lucilla Lame, MD;  Location: [place];  Service: Endoscopy;  Laterality: N/A;   POLYPECTOMY  06/16/2015   Procedure: POLYPECTOMY INTESTINAL;  Surgeon: Lucilla Lame, MD;  Location: [place];  Service: Endoscopy;;  Sigmoid colon polyp    Family History  Problem Relation Age of Onset   Kidney disease Mother    Hypertension Mother    Cancer Sister        breast   Breast cancer Sister 24   Cancer Brother        colon   Kidney disease Brother    Hypertension Father    Cerebral palsy Brother    Colon cancer Brother    HIV/AIDS Brother    Cancer Other    Breast cancer Other     Social History   Tobacco Use   Smoking status: Former    Types: Cigarettes    Quit date: 04/27/2005    Years since quitting: 16.3   Smokeless tobacco: Never  Vaping Use   Vaping Use: Never used  Substance Use Topics   Alcohol use: Yes  Alcohol/week: 2.0 standard drinks of alcohol    Types: 2 Shots of liquor per week   Drug use: Yes    Types: Marijuana    Comment: 1x/mo - last time 06/09/15     No Known Allergies  Health Maintenance  Topic Date Due   Zoster Vaccines- Shingrix (1 of 2) Never done   COLONOSCOPY (Pts 45-17yr Insurance coverage will need to be confirmed)  06/15/2020   MAMMOGRAM  07/26/2021   COVID-19 Vaccine (3 - Pfizer series) 09/29/2021 (Originally 09/28/2019)   INFLUENZA VACCINE  11/06/2021   PAP SMEAR-Modifier  06/01/2023   TETANUS/TDAP  04/27/2025   DEXA SCAN  01/02/2026   Hepatitis C Screening  Completed   HIV Screening  Completed   HPV VACCINES  Aged Out    Chart Review Today: I personally  reviewed active problem list, medication list, allergies, family history, social history, health maintenance, notes from last encounter, lab results, imaging with the patient/caregiver today.   Review of Systems  Constitutional: Negative.   HENT: Negative.    Eyes: Negative.   Respiratory: Negative.    Cardiovascular: Negative.   Gastrointestinal: Negative.   Endocrine: Negative.   Genitourinary: Negative.   Musculoskeletal: Negative.   Skin: Negative.   Allergic/Immunologic: Negative.   Neurological: Negative.   Hematological: Negative.   Psychiatric/Behavioral: Negative.    All other systems reviewed and are negative.    Objective:   Vitals:   09/13/21 0850 09/13/21 0911  BP: (!) 162/98 (!) 152/96  Pulse: 77   Resp: 16   Temp: 98.1 F (36.7 C)   TempSrc: Oral   SpO2: 99%   Weight: 267 lb 11.2 oz (121.4 kg)   Height: '5\' 4"'$  (1.626 m)     Body mass index is 45.95 kg/m.  Physical Exam Vitals and nursing note reviewed.  Constitutional:      General: She is not in acute distress.    Appearance: Normal appearance. She is well-developed and well-groomed. She is obese. She is not ill-appearing, toxic-appearing or diaphoretic.  HENT:     Head: Normocephalic and atraumatic.     Right Ear: External ear normal.     Left Ear: External ear normal.     Nose: Nose normal.  Eyes:     General: Lids are normal. No scleral icterus.       Right eye: No discharge.        Left eye: No discharge.     Conjunctiva/sclera: Conjunctivae normal.  Neck:     Trachea: Phonation normal. No tracheal deviation.  Cardiovascular:     Rate and Rhythm: Normal rate and regular rhythm.     Pulses: Normal pulses.          Radial pulses are 2+ on the right side and 2+ on the left side.       Posterior tibial pulses are 2+ on the right side and 2+ on the left side.     Heart sounds: Normal heart sounds. No murmur heard.    No friction rub. No gallop.  Pulmonary:     Effort: Pulmonary effort is  normal. No respiratory distress.     Breath sounds: Normal breath sounds. No stridor. No wheezing, rhonchi or rales.  Chest:     Chest wall: No tenderness.  Abdominal:     General: Bowel sounds are normal. There is no distension.     Palpations: Abdomen is soft.  Musculoskeletal:     Right lower leg: No edema.     Left  lower leg: No edema.  Skin:    General: Skin is warm and dry.     Coloration: Skin is not jaundiced or pale.     Findings: No rash.  Neurological:     Mental Status: She is alert.     Motor: No abnormal muscle tone.     Gait: Gait normal.  Psychiatric:        Mood and Affect: Mood normal.        Speech: Speech normal.        Behavior: Behavior normal. Behavior is cooperative.         Assessment & Plan:   Problem List Items Addressed This Visit       Cardiovascular and Mediastinum   Essential hypertension, benign - Primary (Chronic)    Previously pt was able to reduce and then stop meds Unfortunately BP high and home and here today BP Readings from Last 3 Encounters:  09/13/21 (!) 152/96  12/19/20 134/82  08/16/20 118/76  no concern for end organ damage Restart meds and f/up in 2 weeks         Relevant Medications   lisinopril-hydrochlorothiazide (ZESTORETIC) 20-12.5 MG tablet   Other Relevant Orders   COMPLETE METABOLIC PANEL WITH GFR (Completed)     Other   Obesity, Class III, BMI 40-49.9 (morbid obesity) ([place])    Did not continue ozempic after last OV with Dr. Ky Barban Weight up ~5 lbs since last years OV Contributing to HLD, HTN, and prediabetes  Wt Readings from Last 5 Encounters:  09/13/21 267 lb 11.2 oz (121.4 kg)  12/19/20 262 lb 14.4 oz (119.3 kg)  08/16/20 260 lb (117.9 kg)  05/31/20 267 lb 4.8 oz (121.2 kg)  05/18/19 261 lb 1.6 oz (118.4 kg)   BMI Readings from Last 5 Encounters:  09/13/21 45.95 kg/m  12/19/20 42.43 kg/m  08/16/20 41.97 kg/m  05/31/20 43.14 kg/m  05/18/19 42.14 kg/m         Relevant Orders    COMPLETE METABOLIC PANEL WITH GFR (Completed)   Hemoglobin A1c (Completed)   Lipid panel (Completed)   Prediabetes    Monitoring, previously given semaglutide, but not currently taking      Relevant Orders   COMPLETE METABOLIC PANEL WITH GFR (Completed)   Hemoglobin A1c (Completed)   Hyperlipidemia    Not on meds, previously improved with diet/lifestyle efforts Lab Results  Component Value Date   CHOL 224 (H) 09/13/2021   CHOL 192 05/31/2020   CHOL 216 (H) 05/18/2019   Lab Results  Component Value Date   HDL 60 09/13/2021   HDL 63 05/31/2020   HDL 63 05/18/2019   Lab Results  Component Value Date   LDLCALC 145 (H) 09/13/2021   LDLCALC 113 (H) 05/31/2020   LDLCALC 132 (H) 05/18/2019   Lab Results  Component Value Date   TRIG 89 09/13/2021   TRIG 70 05/31/2020   TRIG 107 05/18/2019   Lab Results  Component Value Date   CHOLHDL 3.7 09/13/2021   CHOLHDL 3.0 05/31/2020   CHOLHDL 3.4 05/18/2019   Will recalculate ASCVD risk - likely higher risk right now with BP elevated       Relevant Medications   lisinopril-hydrochlorothiazide (ZESTORETIC) 20-12.5 MG tablet   Other Relevant Orders   COMPLETE METABOLIC PANEL WITH GFR (Completed)   Hemoglobin A1c (Completed)   Lipid panel (Completed)   RESOLVED: Encounter for screening mammogram for malignant neoplasm of breast   Relevant Orders   MM 3D SCREEN BREAST BILATERAL  Other Visit Diagnoses     Screening for colon cancer       Relevant Orders   Ambulatory referral to Gastroenterology   Need for shingles vaccine       Rx for vaccine sent to her pharmacy        No follow-ups on file.   Delsa Grana, PA-C 09/13/21 9:31 AM

## 2021-09-13 NOTE — Telephone Encounter (Signed)
Gastroenterology Pre-Procedure Review  Request Date: 10/29/21 Requesting Physician: Dr. Marius Ditch  PATIENT REVIEW QUESTIONS: The patient responded to the following health history questions as indicated:    1. Are you having any GI issues? no 2. Do you have a personal history of Polyps? Yes last colonoscopy performed by Dr. Allen Norris 06/16/15 4 sessile polyps noted 3. Do you have a family history of Colon Cancer or Polyps? yes (brother colon cancer) 4. Diabetes Mellitus? no 5. Joint replacements in the past 12 months?no 6. Major health problems in the past 3 months?no 7. Any artificial heart valves, MVP, or defibrillator?no    MEDICATIONS & ALLERGIES:    Patient reports the following regarding taking any anticoagulation/antiplatelet therapy:   Plavix, Coumadin, Eliquis, Xarelto, Lovenox, Pradaxa, Brilinta, or Effient? no Aspirin? yes (81 mg daily)  Patient confirms/reports the following medications:  Current Outpatient Medications  Medication Sig Dispense Refill   aspirin EC 81 MG tablet Take 81 mg by mouth daily.     Cholecalciferol (VITAMIN D3 PO) Take by mouth.     Cyanocobalamin (VITAMIN B-12 PO) Take 1,000 mcg by mouth 2 (two) times daily.     lisinopril-hydrochlorothiazide (ZESTORETIC) 20-12.5 MG tablet Take 1 tablet by mouth daily. 90 tablet 0   meloxicam (MOBIC) 15 MG tablet Take 1 tablet (15 mg total) by mouth as needed. 30 tablet 3   Semaglutide,0.25 or 0.'5MG'$ /DOS, 2 MG/1.5ML SOPN Inject 0.25 mg into the skin once a week. (Patient not taking: Reported on 09/13/2021) 1.5 mL 0   Zoster Vaccine Adjuvanted [facility]) injection Inject 0.5 mLs into the muscle once for 1 dose. 0.5 mL 0   No current facility-administered medications for this visit.    Patient confirms/reports the following allergies:  No Known Allergies  No orders of the defined types were placed in this encounter.   AUTHORIZATION INFORMATION Primary Insurance: 1D#: Group #:  Secondary Insurance: 1D#: Group  #:  SCHEDULE INFORMATION: Date: 10/29/21 Time: Location: [facility]

## 2021-09-13 NOTE — Patient Instructions (Signed)
Postmenopausal estrogen deficiency  Z78.0 DG Bone Density  menopause over 10 years ago

## 2021-09-14 LAB — LIPID PANEL
Cholesterol: 224 mg/dL — ABNORMAL HIGH (ref <200)
HDL: 60 mg/dL (ref >=50)
LDL Cholesterol (Calc): 145 mg/dL (calc) — ABNORMAL HIGH
Non-HDL Cholesterol (Calc): 164 mg/dL (calc) — ABNORMAL HIGH (ref <130)
Total CHOL/HDL Ratio: 3.7 (calc) (ref <5.0)
Triglycerides: 89 mg/dL (ref <150)

## 2021-09-14 LAB — HEMOGLOBIN A1C
Hgb A1c MFr Bld: 5.5 % of total Hgb (ref <5.7)
Mean Plasma Glucose: 111 mg/dL
eAG (mmol/L): 6.2 mmol/L

## 2021-09-14 LAB — COMPLETE METABOLIC PANEL WITH GFR
AG Ratio: 1.6 (calc) (ref 1.0–2.5)
ALT: 14 U/L (ref 6–29)
AST: 17 U/L (ref 10–35)
Albumin: 4.5 g/dL (ref 3.6–5.1)
Alkaline phosphatase (APISO): 99 U/L (ref 37–153)
BUN: 14 mg/dL (ref 7–25)
CO2: 27 mmol/L (ref 20–32)
Calcium: 9.6 mg/dL (ref 8.6–10.4)
Chloride: 105 mmol/L (ref 98–110)
Creat: 0.71 mg/dL (ref 0.50–1.05)
Globulin: 2.9 g/dL (calc) (ref 1.9–3.7)
Glucose, Bld: 98 mg/dL (ref 65–99)
Potassium: 4.6 mmol/L (ref 3.5–5.3)
Sodium: 139 mmol/L (ref 135–146)
Total Bilirubin: 0.4 mg/dL (ref 0.2–1.2)
Total Protein: 7.4 g/dL (ref 6.1–8.1)
eGFR: 97 mL/min/{1.73_m2} (ref >=60)

## 2021-09-21 ENCOUNTER — Encounter: Payer: Self-pay | Admitting: Family Medicine

## 2021-09-21 ENCOUNTER — Telehealth: Payer: Self-pay

## 2021-09-21 DIAGNOSIS — E785 Hyperlipidemia, unspecified: Secondary | ICD-10-CM | POA: Insufficient documentation

## 2021-09-21 NOTE — Assessment & Plan Note (Signed)
Previously pt was able to reduce and then stop meds Unfortunately BP high and home and here today BP Readings from Last 3 Encounters:  09/13/21 (!) 152/96  12/19/20 134/82  08/16/20 118/76  no concern for end organ damage Restart meds and f/up in 2 weeks

## 2021-09-21 NOTE — Assessment & Plan Note (Signed)
Not on meds, previously improved with diet/lifestyle efforts Lab Results  Component Value Date   CHOL 224 (H) 09/13/2021   CHOL 192 05/31/2020   CHOL 216 (H) 05/18/2019   Lab Results  Component Value Date   HDL 60 09/13/2021   HDL 63 05/31/2020   HDL 63 05/18/2019   Lab Results  Component Value Date   LDLCALC 145 (H) 09/13/2021   LDLCALC 113 (H) 05/31/2020   LDLCALC 132 (H) 05/18/2019   Lab Results  Component Value Date   TRIG 89 09/13/2021   TRIG 70 05/31/2020   TRIG 107 05/18/2019   Lab Results  Component Value Date   CHOLHDL 3.7 09/13/2021   CHOLHDL 3.0 05/31/2020   CHOLHDL 3.4 05/18/2019   Will recalculate ASCVD risk - likely higher risk right now with BP elevated

## 2021-09-21 NOTE — Assessment & Plan Note (Signed)
Monitoring, previously given semaglutide, but not currently taking

## 2021-09-21 NOTE — Telephone Encounter (Signed)
Called patient, Amber Murray PCP wanted to speak to her directly.   Copied from [place] [id number]. Topic: General - Call Back - No Documentation >> Sep 21, 2021  3:09 PM Ja-Kwan M wrote: Reason for CRM: Pt stated she was returning call. Pt stated her blood pressure is being controlled and she would like to make clinical staff aware that she cuts her pill in half. Pt then stated her bp reading this morning was 113/75.

## 2021-09-21 NOTE — Assessment & Plan Note (Signed)
Did not continue ozempic after last OV with Dr. Ky Barban Weight up ~5 lbs since last years OV Contributing to HLD, HTN, and prediabetes  Wt Readings from Last 5 Encounters:  09/13/21 267 lb 11.2 oz (121.4 kg)  12/19/20 262 lb 14.4 oz (119.3 kg)  08/16/20 260 lb (117.9 kg)  05/31/20 267 lb 4.8 oz (121.2 kg)  05/18/19 261 lb 1.6 oz (118.4 kg)   BMI Readings from Last 5 Encounters:  09/13/21 45.95 kg/m  12/19/20 42.43 kg/m  08/16/20 41.97 kg/m  05/31/20 43.14 kg/m  05/18/19 42.14 kg/m

## 2021-09-25 ENCOUNTER — Encounter: Payer: 59 | Admitting: Family Medicine

## 2021-10-02 ENCOUNTER — Other Ambulatory Visit: Payer: Self-pay

## 2021-10-02 MED ORDER — GOLYTELY 236 G PO SOLR: 4000.0000 mL | Freq: Once | ORAL | 0 refills | Status: AC

## 2021-10-03 ENCOUNTER — Telehealth: Payer: Self-pay | Admitting: Family Medicine

## 2021-10-03 DIAGNOSIS — E785 Hyperlipidemia, unspecified: Secondary | ICD-10-CM

## 2021-10-03 MED ORDER — SIMVASTATIN 20 MG PO TABS: 20.0000 mg | ORAL_TABLET | Freq: Every day | ORAL | 0 refills | Status: DC

## 2021-10-03 NOTE — Telephone Encounter (Signed)
Notified pt and she gave verbal understanding.

## 2021-10-03 NOTE — Telephone Encounter (Unsigned)
Copied from [place] [id number]. Topic: General - Inquiry >> Oct 02, 2021  4:28 PM Leilani Able wrote: Reason for CRM: Pt had an appt on the 6/8 and states after labs came back that she was told that dr was calling in a script for her cholesterol and it has not been sent in. Pt was you to advise. [phone]

## 2021-10-15 NOTE — Patient Instructions (Signed)
Preventive Care 40-61 Years Old, Female Preventive care refers to lifestyle choices and visits with your health care provider that can promote health and wellness. Preventive care visits are also called wellness exams. What can I expect for my preventive care visit? Counseling Your health care provider may ask you questions about your: Medical history, including: Past medical problems. Family medical history. Pregnancy history. Current health, including: Menstrual cycle. Method of birth control. Emotional well-being. Home life and relationship well-being. Sexual activity and sexual health. Lifestyle, including: Alcohol, nicotine or tobacco, and drug use. Access to firearms. Diet, exercise, and sleep habits. Work and work environment. Sunscreen use. Safety issues such as seatbelt and bike helmet use. Physical exam Your health care provider will check your: Height and weight. These may be used to calculate your BMI (body mass index). BMI is a measurement that tells if you are at a healthy weight. Waist circumference. This measures the distance around your waistline. This measurement also tells if you are at a healthy weight and may help predict your risk of certain diseases, such as type 2 diabetes and high blood pressure. Heart rate and blood pressure. Body temperature. Skin for abnormal spots. What immunizations do I need?  Vaccines are usually given at various ages, according to a schedule. Your health care provider will recommend vaccines for you based on your age, medical history, and lifestyle or other factors, such as travel or where you work. What tests do I need? Screening Your health care provider may recommend screening tests for certain conditions. This may include: Lipid and cholesterol levels. Diabetes screening. This is done by checking your blood sugar (glucose) after you have not eaten for a while (fasting). Pelvic exam and Pap test. Hepatitis B test. Hepatitis C  test. HIV (human immunodeficiency virus) test. STI (sexually transmitted infection) testing, if you are at risk. Lung cancer screening. Colorectal cancer screening. Mammogram. Talk with your health care provider about when you should start having regular mammograms. This may depend on whether you have a family history of breast cancer. BRCA-related cancer screening. This may be done if you have a family history of breast, ovarian, tubal, or peritoneal cancers. Bone density scan. This is done to screen for osteoporosis. Talk with your health care provider about your test results, treatment options, and if necessary, the need for more tests. Follow these instructions at home: Eating and drinking  Eat a diet that includes fresh fruits and vegetables, whole grains, lean protein, and low-fat dairy products. Take vitamin and mineral supplements as recommended by your health care provider. Do not drink alcohol if: Your health care provider tells you not to drink. You are pregnant, may be pregnant, or are planning to become pregnant. If you drink alcohol: Limit how much you have to 0-1 drink a day. Know how much alcohol is in your drink. In the [place] one drink equals one 12 oz bottle of beer (355 mL), one 5 oz glass of wine (148 mL), or one 1 oz glass of hard liquor (44 mL). Lifestyle Brush your teeth every morning and night with fluoride toothpaste. Floss one time each day. Exercise for at least 30 minutes 5 or more days each week. Do not use any products that contain nicotine or tobacco. These products include cigarettes, chewing tobacco, and vaping devices, such as e-cigarettes. If you need help quitting, ask your health care provider. Do not use drugs. If you are sexually active, practice safe sex. Use a condom or other form of protection to   prevent STIs. If you do not wish to become pregnant, use a form of birth control. If you plan to become pregnant, see your health care provider for a  prepregnancy visit. Take aspirin only as told by your health care provider. Make sure that you understand how much to take and what form to take. Work with your health care provider to find out whether it is safe and beneficial for you to take aspirin daily. Find healthy ways to manage stress, such as: Meditation, yoga, or listening to music. Journaling. Talking to a trusted person. Spending time with friends and family. Minimize exposure to UV radiation to reduce your risk of skin cancer. Safety Always wear your seat belt while driving or riding in a vehicle. Do not drive: If you have been drinking alcohol. Do not ride with someone who has been drinking. When you are tired or distracted. While texting. If you have been using any mind-altering substances or drugs. Wear a helmet and other protective equipment during sports activities. If you have firearms in your house, make sure you follow all gun safety procedures. Seek help if you have been physically or sexually abused. What's next? Visit your health care provider once a year for an annual wellness visit. Ask your health care provider how often you should have your eyes and teeth checked. Stay up to date on all vaccines. This information is not intended to replace advice given to you by your health care provider. Make sure you discuss any questions you have with your health care provider. Document Revised: 09/20/2020 Document Reviewed: 09/20/2020 Elsevier Patient Education  [place].

## 2021-10-16 ENCOUNTER — Encounter: Payer: Self-pay | Admitting: Family Medicine

## 2021-10-16 ENCOUNTER — Ambulatory Visit (INDEPENDENT_AMBULATORY_CARE_PROVIDER_SITE_OTHER): Payer: 59 | Admitting: Family Medicine

## 2021-10-16 VITALS — BP 118/76 | HR 77 | Temp 98.4°F | Resp 16 | Ht 65.0 in | Wt 270.4 lb

## 2021-10-16 DIAGNOSIS — I10 Essential (primary) hypertension: Secondary | ICD-10-CM | POA: Diagnosis not present

## 2021-10-16 DIAGNOSIS — Z1211 Encounter for screening for malignant neoplasm of colon: Secondary | ICD-10-CM

## 2021-10-16 DIAGNOSIS — Z0000 Encounter for general adult medical examination without abnormal findings: Secondary | ICD-10-CM | POA: Diagnosis not present

## 2021-10-16 DIAGNOSIS — E785 Hyperlipidemia, unspecified: Secondary | ICD-10-CM | POA: Diagnosis not present

## 2021-10-16 DIAGNOSIS — Z23 Encounter for immunization: Secondary | ICD-10-CM

## 2021-10-16 MED ORDER — ZOSTER VAC RECOMB ADJUVANTED 50 MCG/0.5ML IM SUSR: 0.5000 mL | Freq: Once | INTRAMUSCULAR | 1 refills | Status: AC

## 2021-10-16 MED ORDER — LISINOPRIL 10 MG PO TABS: 10.0000 mg | ORAL_TABLET | Freq: Every day | ORAL | 3 refills | Status: DC

## 2021-10-16 NOTE — Progress Notes (Signed)
Patient: Amber Murray, Female    DOB: 1960-12-25, 61 y.o.   MRN: [id number] Delsa Grana, PA-C Visit Date: 10/16/2021  Today's Provider: Delsa Grana, PA-C   Chief Complaint  Patient presents with   Annual Exam   Subjective:   Annual physical exam:  Amber Murray is a 61 y.o. female who presents today for complete physical exam: She did labs last month  Exercise/Activity:  walking Diet/nutrition:  starting vegetarian diet Sleep: no concerns  SDOH Screenings   Alcohol Screen: Low Risk  (10/16/2021)   Alcohol Screen    Last Alcohol Screening Score (AUDIT): 0  Depression (PHQ2-9): Low Risk  (10/16/2021)   Depression (PHQ2-9)    PHQ-2 Score: 0  Financial Resource Strain: Low Risk  (10/16/2021)   Overall Financial Resource Strain (CARDIA)    Difficulty of Paying Living Expenses: Not hard at all  Food Insecurity: No Food Insecurity (10/16/2021)   Hunger Vital Sign    Worried About Running Out of Food in the Last Year: Never true    Ran Out of Food in the Last Year: Never true  Housing: Low Risk  (10/16/2021)   Housing    Last Housing Risk Score: 0  Physical Activity: Insufficiently Active (10/16/2021)   Exercise Vital Sign    Days of Exercise per Week: 5 days    Minutes of Exercise per Session: 20 min  Social Connections: Socially Integrated (10/16/2021)   Social Connection and Isolation Panel [NHANES]    Frequency of Communication with Friends and Family: Three times a week    Frequency of Social Gatherings with Friends and Family: Three times a week    Attends Religious Services: More than 4 times per year    Active Member of Clubs or Organizations: Yes    Attends Archivist Meetings: More than 4 times per year    Marital Status: Married  Stress: Stress Concern Present (10/16/2021)   [place]    Feeling of Stress : Rather much  Tobacco Use: Medium Risk (10/16/2021)   Patient  History    Smoking Tobacco Use: Former    Smokeless Tobacco Use: Never    Passive Exposure: Not on file  Transportation Needs: No Transportation Needs (10/16/2021)   PRAPARE - Hydrologist (Medical): No    Lack of Transportation (Non-Medical): No   in addition to CPE. Advised pt of separate visit billing/coding  USPSTF grade A and B recommendations - reviewed and addressed today  Depression:  Phq 9 completed today by patient, was reviewed by me with patient in the room PHQ score is neg, pt feels good    10/16/2021    9:06 AM 09/13/2021    8:49 AM 12/19/2020   10:09 AM 08/16/2020    3:13 PM  PHQ 2/9 Scores  PHQ - 2 Score 0 0 0 0  PHQ- 9 Score 0 0 0 0      10/16/2021    9:06 AM 09/13/2021    8:49 AM 12/19/2020   10:09 AM 08/16/2020    3:13 PM 05/31/2020   10:26 AM  Depression screen PHQ 2/9  Decreased Interest 0 0 0 0 0  Down, Depressed, Hopeless 0 0 0 0 0  PHQ - 2 Score 0 0 0 0 0  Altered sleeping 0 0 0 0 0  Tired, decreased energy 0 0 0 0 0  Change in appetite 0 0 0 0 0  Feeling  bad or failure about yourself  0 0 0 0 0  Trouble concentrating 0 0 0 0 0  Moving slowly or fidgety/restless 0 0 0 0 0  Suicidal thoughts 0 0 0 0 0  PHQ-9 Score 0 0 0 0 0  Difficult doing work/chores Not difficult at all Not difficult at all Not difficult at all Not difficult at all     Alcohol screening: [place] Office Visit from 10/16/20[facility] in [facility]  AUDIT-C Score 0       Immunizations and Health Maintenance: Health Maintenance  Topic Date Due   Zoster Vaccines- Shingrix (1 of 2) Never done   COVID-19 Vaccine (3 - Pfizer series) 09/28/2019   COLONOSCOPY (Pts 45-54yr Insurance coverage will need to be confirmed)  06/15/2020   MAMMOGRAM  07/26/2021   INFLUENZA VACCINE  11/06/2021   PAP SMEAR-Modifier  06/01/2023   TETANUS/TDAP  04/27/2025   DEXA SCAN  01/02/2026   Hepatitis C Screening  Completed   HIV Screening  Completed    HPV VACCINES  Aged Out     Hep C Screening: done  STD testing and prevention (HIV/chl/gon/syphilis):  see above, no additional testing desired by pt today- none need3ed  Intimate partner violence:safe  Sexual History/Pain during Intercourse:  Married , none, denies  Menstrual History/LMP/Abnormal Bleeding: no AUB 12 years ago menopause No LMP recorded. Patient is postmenopausal.  Incontinence Symptoms:  mild stress incontinence   Breast cancer:  due Last Mammogram: *see HM list above BRCA gene screening:   Cervical cancer screening: due 2025 Pt denies family hx of cancers - breast, ovarian, uterine, colon:     Osteoporosis:   vit d supplement  Discussion on osteoporosis per age, including high calcium and vitamin D supplementation, weight bearing exercises Ordered- Bone scan/dexa   Skin cancer:  Hx of skin CA -  NO Discussed atypical lesions   Colorectal cancer:   Colonoscopy is  Discussed concerning signs and sx of CRC, pt denies   Lung cancer:   Low Dose CT Chest recommended if Age 61-80years, 20 pack-year currently smoking OR have quit w/in 15years. Patient does not qualify.    Social History   Tobacco Use   Smoking status: Former    Types: Cigarettes    Quit date: 04/27/2005    Years since quitting: 16.4   Smokeless tobacco: Never  Vaping Use   Vaping Use: Never used  Substance Use Topics   Alcohol use: Yes    Alcohol/week: 2.0 standard drinks of alcohol    Types: 2 Shots of liquor per week   Drug use: Not Currently    Types: Marijuana    Comment: 1x/mo - last time 06/09/15     Flowsheet Row Office Visit from 10/16/2021 in [facility] AUDIT-C Score 0       Family History  Problem Relation Age of Onset   Kidney disease Mother    Hypertension Mother    Cancer Sister        breast   Breast cancer Sister 563  Cancer Brother        colon   Kidney disease Brother    Hypertension Father    Cerebral palsy Brother    Colon cancer  Brother    HIV/AIDS Brother    Cancer Other    Breast cancer Other      Blood pressure/Hypertension: BP Readings from Last 3 Encounters:  10/16/21 118/76  09/13/21 (!) 152/96  12/19/20 134/82  Weight/Obesity: Wt Readings from Last 3 Encounters:  10/16/21 270 lb 6.4 oz (122.7 kg)  09/13/21 267 lb 11.2 oz (121.4 kg)  12/19/20 262 lb 14.4 oz (119.3 kg)   BMI Readings from Last 3 Encounters:  10/16/21 45.00 kg/m  09/13/21 45.95 kg/m  12/19/20 42.43 kg/m     Lipids:  Lab Results  Component Value Date   CHOL 224 (H) 09/13/2021   CHOL 192 05/31/2020   CHOL 216 (H) 05/18/2019   Lab Results  Component Value Date   HDL 60 09/13/2021   HDL 63 05/31/2020   HDL 63 05/18/2019   Lab Results  Component Value Date   LDLCALC 145 (H) 09/13/2021   LDLCALC 113 (H) 05/31/2020   LDLCALC 132 (H) 05/18/2019   Lab Results  Component Value Date   TRIG 89 09/13/2021   TRIG 70 05/31/2020   TRIG 107 05/18/2019   Lab Results  Component Value Date   CHOLHDL 3.7 09/13/2021   CHOLHDL 3.0 05/31/2020   CHOLHDL 3.4 05/18/2019   No results found for: "LDLDIRECT" Based on the results of lipid panel his/her cardiovascular risk factor ( using [place] )  in the next 10 years is: The 10-year ASCVD risk score (Arnett DK, et al., 2019) is: 6.1%   Values used to calculate the score:     Age: 63 years     Sex: Female     Is Non-Hispanic African American: Yes     Diabetic: No     Tobacco smoker: No     Systolic Blood Pressure: 315 mmHg     Is BP treated: Yes     HDL Cholesterol: 60 mg/dL     Total Cholesterol: 224 mg/dL  Glucose:  Glucose, Bld  Date Value Ref Range Status  09/13/2021 98 65 - 99 mg/dL Final    Comment:    .            Fasting reference interval .   05/31/2020 89 65 - 99 mg/dL Final    Comment:    .            Fasting reference interval .   05/18/2019 88 65 - 99 mg/dL Final    Comment:    .            Fasting reference interval .     Advanced Care  Planning:  A voluntary discussion about advance care planning including the explanation and discussion of advance directives.   Discussed health care proxy and Living will, and the patient was able to identify a health care proxy as spouse.   Patient does not have a living will at present time.   Social History       Social History   Socioeconomic History   Marital status: Married    Spouse name: John   Number of children: 3   Years of education: 12   Highest education level: Some college, no degree  Occupational History   Not on file  Tobacco Use   Smoking status: Former    Types: Cigarettes    Quit date: 04/27/2005    Years since quitting: 16.4   Smokeless tobacco: Never  Vaping Use   Vaping Use: Never used  Substance and Sexual Activity   Alcohol use: Yes    Alcohol/week: 2.0 standard drinks of alcohol    Types: 2 Shots of liquor per week   Drug use: Not Currently    Types: Marijuana    Comment: 1x/mo - last  time 06/09/15   Sexual activity: Yes    Birth control/protection: Post-menopausal  Other Topics Concern   Not on file  Social History Narrative   Not on file   Social Determinants of Health   Financial Resource Strain: Low Risk  (10/16/2021)   Overall Financial Resource Strain (CARDIA)    Difficulty of Paying Living Expenses: Not hard at all  Food Insecurity: No Food Insecurity (10/16/2021)   Hunger Vital Sign    Worried About Running Out of Food in the Last Year: Never true    Ran Out of Food in the Last Year: Never true  Transportation Needs: No Transportation Needs (10/16/2021)   PRAPARE - Hydrologist (Medical): No    Lack of Transportation (Non-Medical): No  Physical Activity: Insufficiently Active (10/16/2021)   Exercise Vital Sign    Days of Exercise per Week: 5 days    Minutes of Exercise per Session: 20 min  Stress: Stress Concern Present (10/16/2021)   [place]    Feeling of Stress : Rather much  Social Connections: Socially Integrated (10/16/2021)   Social Connection and Isolation Panel [NHANES]    Frequency of Communication with Friends and Family: Three times a week    Frequency of Social Gatherings with Friends and Family: Three times a week    Attends Religious Services: More than 4 times per year    Active Member of Clubs or Organizations: Yes    Attends Music therapist: More than 4 times per year    Marital Status: Married    Family History        Family History  Problem Relation Age of Onset   Kidney disease Mother    Hypertension Mother    Cancer Sister        breast   Breast cancer Sister 20   Cancer Brother        colon   Kidney disease Brother    Hypertension Father    Cerebral palsy Brother    Colon cancer Brother    HIV/AIDS Brother    Cancer Other    Breast cancer Other     Patient Active Problem List   Diagnosis Date Noted   Hyperlipidemia 09/21/2021   Essential hypertension, benign 11/14/2015   Prediabetes 11/13/2015   Benign neoplasm of rectosigmoid junction    Family history of breast cancer 04/28/2015   Family history of colon cancer 04/28/2015   Obesity, Class III, BMI 40-49.9 (morbid obesity) ([place]) 04/28/2015   Need for diphtheria-tetanus-pertussis (Tdap) vaccine, adult/adolescent 04/28/2015    Past Surgical History:  Procedure Laterality Date   CHOLECYSTECTOMY     COLONOSCOPY WITH PROPOFOL N/A 06/16/2015   Procedure: COLONOSCOPY WITH PROPOFOL;  Surgeon: Lucilla Lame, MD;  Location: [place];  Service: Endoscopy;  Laterality: N/A;   POLYPECTOMY  06/16/2015   Procedure: POLYPECTOMY INTESTINAL;  Surgeon: Lucilla Lame, MD;  Location: [place];  Service: Endoscopy;;  Sigmoid colon polyp     Current Outpatient Medications:    aspirin EC 81 MG tablet, Take 81 mg by mouth daily., Disp: , Rfl:    Cholecalciferol (VITAMIN D3 PO), Take by mouth., Disp: , Rfl:     Cyanocobalamin (VITAMIN B-12 PO), Take 1,000 mcg by mouth 2 (two) times daily., Disp: , Rfl:    lisinopril-hydrochlorothiazide (ZESTORETIC) 20-12.5 MG tablet, Take 1 tablet by mouth daily., Disp: 90 tablet, Rfl: 0   meloxicam (MOBIC) 15 MG tablet,  Take 1 tablet (15 mg total) by mouth as needed., Disp: 30 tablet, Rfl: 3   Na Sulfate-K Sulfate-Mg Sulf 17.5-3.13-1.6 GM/177ML SOLN, Take by mouth., Disp: , Rfl:    simvastatin (ZOCOR) 20 MG tablet, Take 1 tablet (20 mg total) by mouth at bedtime., Disp: 60 tablet, Rfl: 0  No Known Allergies  Patient Care Team: Delsa Grana, PA-C as PCP - General (Family Medicine)   Chart Review: I personally reviewed active problem list, medication list, allergies, family history, social history, health maintenance, notes from last encounter, lab results, imaging with the patient/caregiver today.   Review of Systems  Constitutional: Negative.   HENT: Negative.    Eyes: Negative.   Respiratory: Negative.    Cardiovascular: Negative.   Gastrointestinal: Negative.   Endocrine: Negative.   Genitourinary: Negative.   Musculoskeletal: Negative.   Skin: Negative.   Allergic/Immunologic: Negative.   Neurological: Negative.   Hematological: Negative.   Psychiatric/Behavioral: Negative.    All other systems reviewed and are negative.         Objective:   Vitals:  Vitals:   10/16/21 0910  BP: 118/76  Pulse: 77  Resp: 16  Temp: 98.4 F (36.9 C)  TempSrc: Oral  SpO2: 96%  Weight: 270 lb 6.4 oz (122.7 kg)  Height: 5' 5" (1.651 m)    Body mass index is 45 kg/m.  Physical Exam Vitals and nursing note reviewed.  Constitutional:      General: She is not in acute distress.    Appearance: Normal appearance. She is well-developed and well-groomed. She is obese. She is not ill-appearing, toxic-appearing or diaphoretic.  HENT:     Head: Normocephalic and atraumatic.     Right Ear: External ear normal. There is no impacted cerumen.     Left Ear:  External ear normal. There is impacted cerumen.     Nose: Nose normal. No congestion.     Mouth/Throat:     Mouth: Mucous membranes are moist.     Pharynx: Oropharynx is clear. No oropharyngeal exudate or posterior oropharyngeal erythema.  Eyes:     General: Lids are normal. No scleral icterus.       Right eye: No discharge.        Left eye: No discharge.     Conjunctiva/sclera: Conjunctivae normal.  Neck:     Trachea: Phonation normal. No tracheal deviation.  Cardiovascular:     Rate and Rhythm: Normal rate and regular rhythm.     Pulses: Normal pulses.          Radial pulses are 2+ on the right side and 2+ on the left side.       Posterior tibial pulses are 2+ on the right side and 2+ on the left side.     Heart sounds: Normal heart sounds. No murmur heard.    No friction rub. No gallop.  Pulmonary:     Effort: Pulmonary effort is normal. No respiratory distress.     Breath sounds: Normal breath sounds. No stridor. No wheezing, rhonchi or rales.  Chest:     Chest wall: No tenderness.  Abdominal:     General: Bowel sounds are normal. There is no distension.     Palpations: Abdomen is soft.  Musculoskeletal:     Right lower leg: No edema.     Left lower leg: No edema.  Skin:    General: Skin is warm and dry.     Capillary Refill: Capillary refill takes less than 2 seconds.  Coloration: Skin is not jaundiced or pale.     Findings: No rash.  Neurological:     Mental Status: She is alert. Mental status is at baseline.     Motor: No abnormal muscle tone.     Gait: Gait normal.  Psychiatric:        Mood and Affect: Mood normal.        Speech: Speech normal.        Behavior: Behavior normal. Behavior is cooperative.       Fall Risk:    10/16/2021    9:06 AM 09/13/2021    8:49 AM 12/19/2020   10:07 AM 08/16/2020    3:12 PM 05/31/2020   10:26 AM  Fall Risk   Falls in the past year? 0 0 0 1 0  Number falls in past yr: 0 0 0 0 0  Injury with Fall? 0 0 0 0 0  Risk for  fall due to : No Fall Risks No Fall Risks     Follow up Falls prevention discussed;Education provided Falls prevention discussed;Education provided       Functional Status Survey: Is the patient deaf or have difficulty hearing?: No Does the patient have difficulty seeing, even when wearing glasses/contacts?: No Does the patient have difficulty concentrating, remembering, or making decisions?: No Does the patient have difficulty walking or climbing stairs?: No Does the patient have difficulty dressing or bathing?: No Does the patient have difficulty doing errands alone such as visiting a doctor's office or shopping?: No   Assessment & Plan:    CPE completed today  USPSTF grade A and B recommendations reviewed with patient; age-appropriate recommendations, preventive care, screening tests, etc discussed and encouraged; healthy living encouraged; see AVS for patient education given to patient  Discussed importance of 150 minutes of physical activity weekly, AHA exercise recommendations given to pt in AVS/handout  Discussed importance of healthy diet:  eating lean meats and proteins, avoiding trans fats and saturated fats, avoid simple sugars and excessive carbs in diet, eat 6 servings of fruit/vegetables daily and drink plenty of water and avoid sweet beverages.    Recommended pt to do annual eye exam and routine dental exams/cleanings  Depression, alcohol, fall screening completed as documented above and per flowsheets  Advance Care planning information and packet discussed and offered today, encouraged pt to discuss with family members/spouse/partner/friends and complete Advanced directive packet and bring copy to office   Reviewed Health Maintenance: Health Maintenance  Topic Date Due   Zoster Vaccines- Shingrix (1 of 2) Never done   COVID-19 Vaccine (3 - Pfizer series) 09/28/2019   COLONOSCOPY (Pts 45-48yr Insurance coverage will need to be confirmed)  06/15/2020   MAMMOGRAM   07/26/2021   INFLUENZA VACCINE  11/06/2021   PAP SMEAR-Modifier  06/01/2023   TETANUS/TDAP  04/27/2025   DEXA SCAN  01/02/2026   Hepatitis C Screening  Completed   HIV Screening  Completed   HPV VACCINES  Aged Out    Immunizations: Immunization History  Administered Date(s) Administered   PFIZER(Purple Top)SARS-COV-2 Vaccination 07/09/2019, 08/03/2019   Tdap 04/28/2015   Vaccines:  HPV: up to at age 762, ask insurance if age between 220-45 Shingrix: 548-64yo and ask insurance if covered when patient above 651yo Pneumonia:  educated and discussed with patient. Flu: not due/out of season/ educated and discussed with patient.     ICD-10-CM   1. Annual physical exam  Z00.00     2. Essential hypertension, benign  I10 lisinopril (ZESTRIL) 10 MG tablet   sensitive to HCTZ added to allergy list, new med sent in    3. Hyperlipidemia, unspecified hyperlipidemia type  E78.5    discussed her changing ASCVD risk % with higher or lower BP - she chose to continue statin and recheck labs in about 3 months, no SE or concerns    4. Need for shingles vaccine  Z23 Zoster Vaccine Adjuvanted [facility]) injection    5. Obesity, Class III, BMI 40-49.9 (morbid obesity) ([facility])  E66.01       3 month f/up labs unless having SE or problems with BP control     Delsa Grana, PA-C 10/16/21 9:31 AM  [facility]

## 2021-10-22 IMAGING — MG MM DIGITAL SCREENING BILAT W/ TOMO AND CAD
8 series · 8 of 24 positions shown · non-contrast
Comparison: Previous exam(s).

ACR Breast Density Category a: The breast tissue is almost entirely
fatty.

CLINICAL DATA: Screening.

EXAM:
DIGITAL SCREENING BILATERAL MAMMOGRAM WITH TOMOSYNTHESIS AND CAD
TECHNIQUE: Bilateral screening digital craniocaudal and mediolateral oblique
mammograms were obtained. Bilateral screening digital breast
tomosynthesis was performed. The images were evaluated with
computer-aided detection.

[L MLO synth-2D]
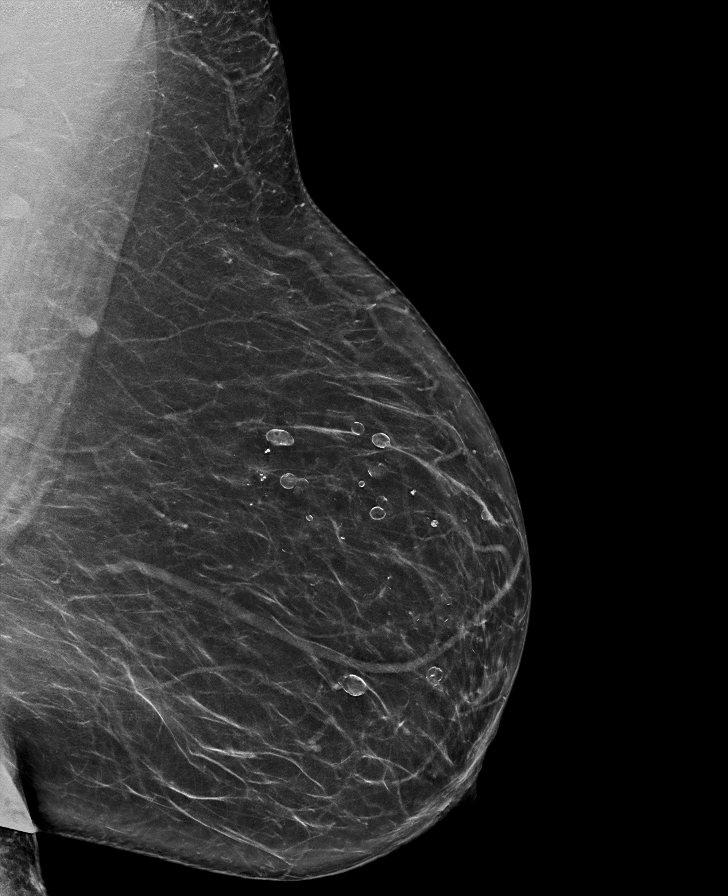

[R MLO synth-2D]
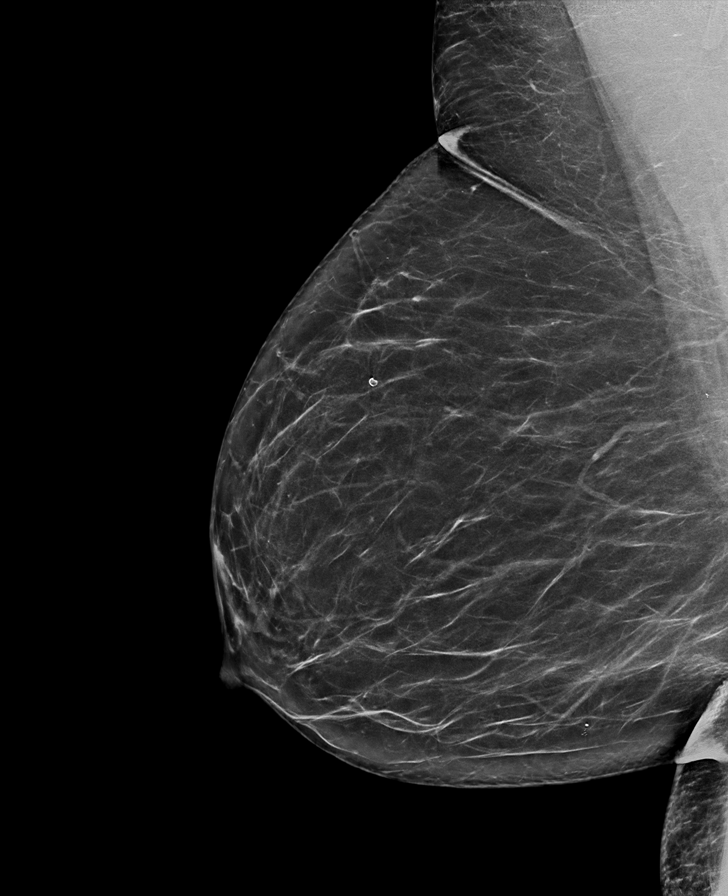

[L CC synth-2D]
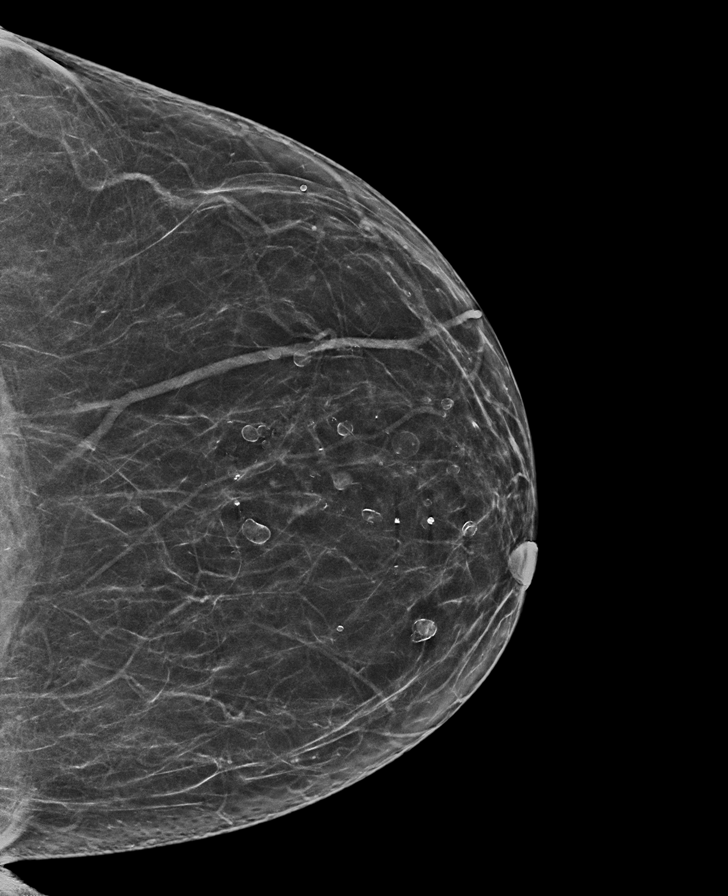

[R CC synth-2D]
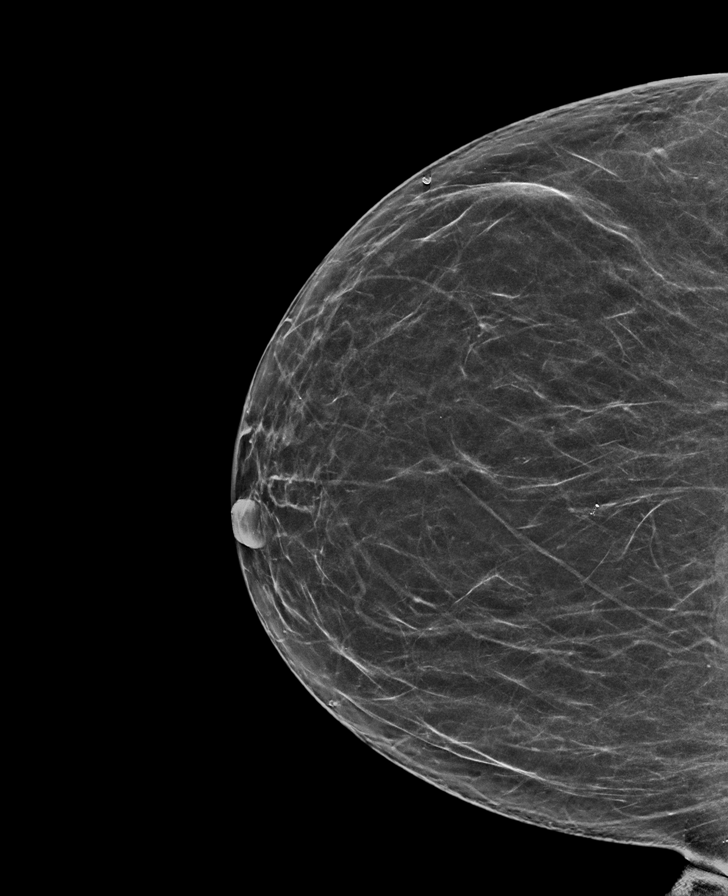

[L MLO tomo · tomo slice 39/76.0]
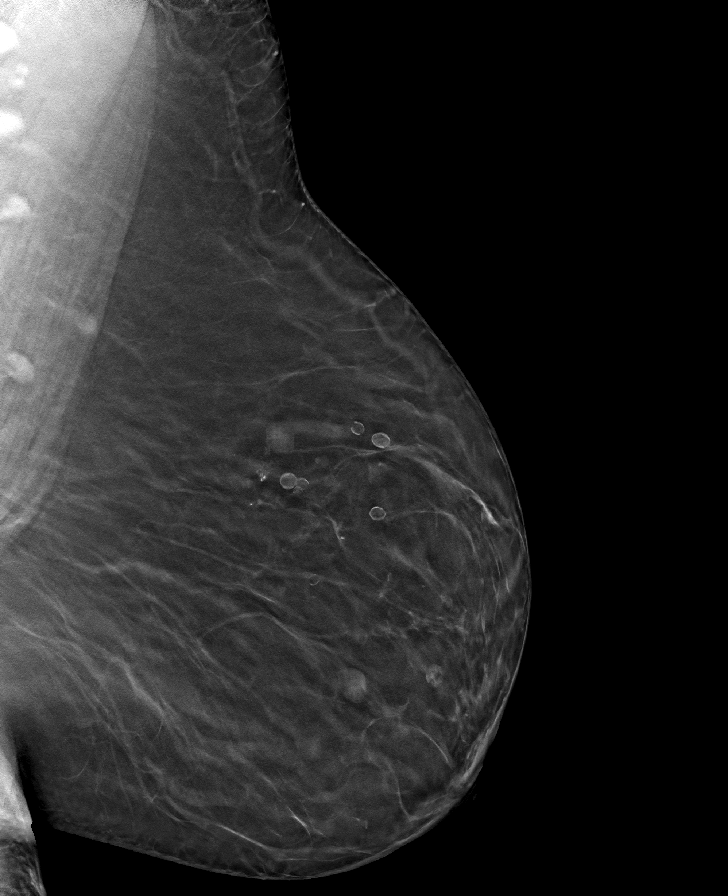

[R CC tomo · tomo slice 31/62.0]
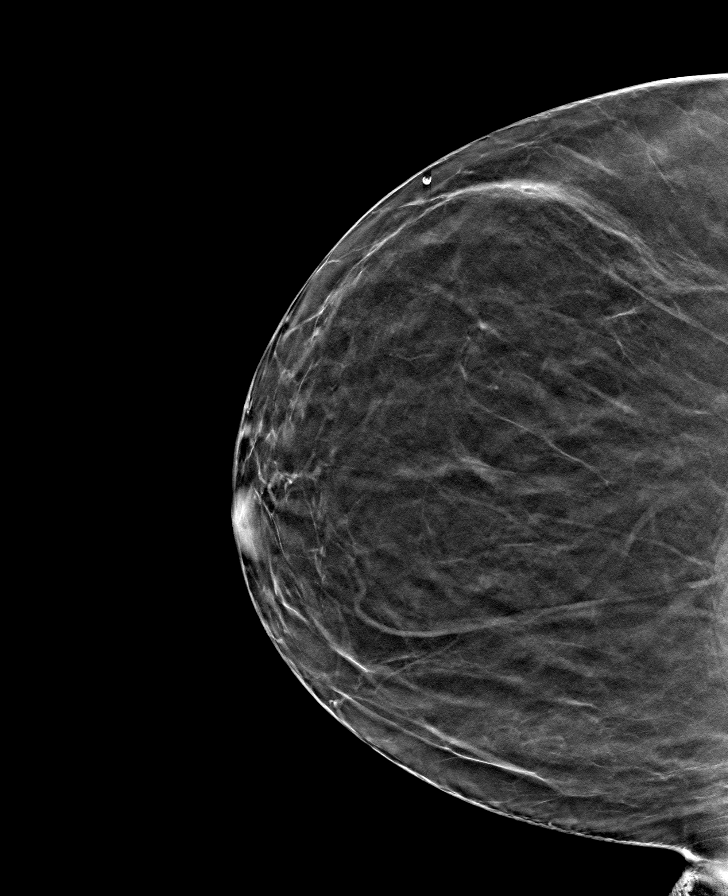

[R MLO tomo · tomo slice 37/74.0]
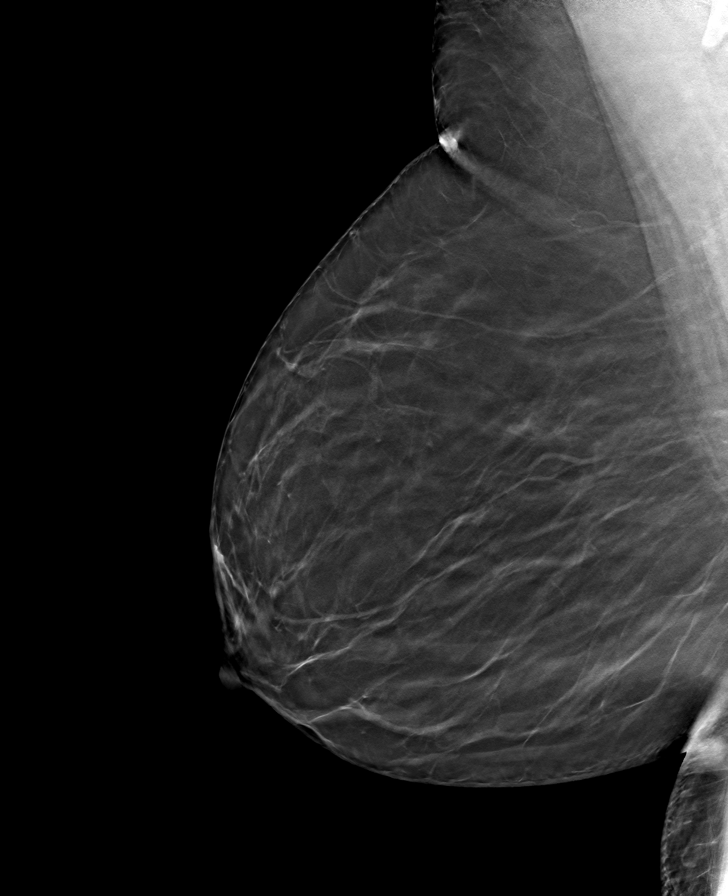

[L CC tomo · tomo slice 33/64.0]
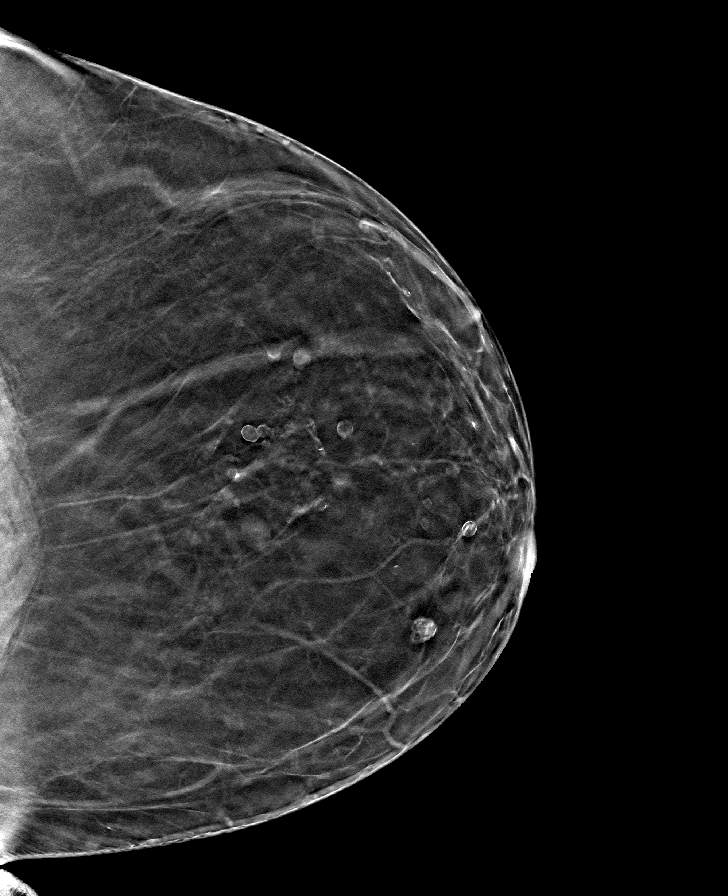

[8 of 24 positions shown; findings below may reference images not displayed]

FINDINGS: There are no findings suspicious for malignancy. The images were
evaluated with computer-aided detection.
IMPRESSION: No mammographic evidence of malignancy. A result letter of this
screening mammogram will be mailed directly to the patient.

RECOMMENDATION:
Screening mammogram in one year. (Code:JP-J-DD5)

BI-RADS CATEGORY  1: Negative.

## 2021-10-26 ENCOUNTER — Encounter: Payer: Self-pay | Admitting: Gastroenterology

## 2021-10-29 ENCOUNTER — Ambulatory Visit: Payer: 59 | Admitting: Certified Registered Nurse Anesthetist

## 2021-10-29 ENCOUNTER — Ambulatory Visit
Admission: RE | Admit: 2021-10-29 | Discharge: 2021-10-29 | Disposition: A | Discharge status: Home / Self Care | Payer: 59 | Attending: Gastroenterology | Admitting: Gastroenterology

## 2021-10-29 ENCOUNTER — Encounter: Payer: Self-pay | Admitting: Gastroenterology

## 2021-10-29 ENCOUNTER — Encounter
Admission: RE | Disposition: A | Discharge status: Home / Self Care | Payer: Self-pay | Source: Home / Self Care | Attending: Gastroenterology

## 2021-10-29 DIAGNOSIS — K644 Residual hemorrhoidal skin tags: Secondary | ICD-10-CM | POA: Diagnosis not present

## 2021-10-29 DIAGNOSIS — R7303 Prediabetes: Secondary | ICD-10-CM | POA: Diagnosis not present

## 2021-10-29 DIAGNOSIS — Z800 Family history of malignant neoplasm of digestive organs: Secondary | ICD-10-CM | POA: Insufficient documentation

## 2021-10-29 DIAGNOSIS — Z87891 Personal history of nicotine dependence: Secondary | ICD-10-CM | POA: Diagnosis not present

## 2021-10-29 DIAGNOSIS — Z1211 Encounter for screening for malignant neoplasm of colon: Secondary | ICD-10-CM | POA: Diagnosis present

## 2021-10-29 DIAGNOSIS — Z6841 Body Mass Index (BMI) 40.0 and over, adult: Secondary | ICD-10-CM | POA: Diagnosis not present

## 2021-10-29 DIAGNOSIS — K219 Gastro-esophageal reflux disease without esophagitis: Secondary | ICD-10-CM | POA: Insufficient documentation

## 2021-10-29 DIAGNOSIS — Z86010 Personal history of colonic polyps: Secondary | ICD-10-CM

## 2021-10-29 HISTORY — PX: COLONOSCOPY: SHX5424

## 2021-10-29 HISTORY — DX: Hyperlipidemia, unspecified: E78.5

## 2021-10-29 SURGERY — COLONOSCOPY
Anesthesia: General

## 2021-10-29 MED ORDER — LIDOCAINE HCL (CARDIAC) PF 100 MG/5ML IV SOSY
PREFILLED_SYRINGE | INTRAVENOUS | Status: DC | PRN
  Administered 2021-10-29: 50 mg via INTRAVENOUS

## 2021-10-29 MED ORDER — SODIUM CHLORIDE 0.9 % IV SOLN: INTRAVENOUS | Status: DC

## 2021-10-29 MED ORDER — PROPOFOL 10 MG/ML IV BOLUS
INTRAVENOUS | Status: DC | PRN
  Administered 2021-10-29: 70 mg via INTRAVENOUS
  Administered 2021-10-29: 30 mg via INTRAVENOUS

## 2021-10-29 MED ORDER — PROPOFOL 500 MG/50ML IV EMUL
INTRAVENOUS | Status: DC | PRN
  Administered 2021-10-29: 140 ug/kg/min via INTRAVENOUS

## 2021-10-29 NOTE — H&P (Addendum)
Cephas Darby, MD [address]  [place], [place] [address]  Main: [phone]  Fax: [phone] Pager: [phone]  Primary Care Physician:  Delsa Grana, PA-C Primary Gastroenterologist:  Dr. Cephas Darby  Pre-Procedure History & Physical: HPI:  Amber Murray is a 61 y.o. female is here for an colonoscopy.   Past Medical History:  Diagnosis Date   GERD (gastroesophageal reflux disease)    occasional    HLD (hyperlipidemia)    Hypertension    Obesity    Prediabetes 11/13/2015   Vertigo    noo episodes in several yrs   Wears contact lenses    sometimes    Past Surgical History:  Procedure Laterality Date   CHOLECYSTECTOMY     COLONOSCOPY WITH PROPOFOL N/A 06/16/2015   Procedure: COLONOSCOPY WITH PROPOFOL;  Surgeon: Lucilla Lame, MD;  Location: [facility];  Service: Endoscopy;  Laterality: N/A;   POLYPECTOMY  06/16/2015   Procedure: POLYPECTOMY INTESTINAL;  Surgeon: Lucilla Lame, MD;  Location: [facility];  Service: Endoscopy;;  Sigmoid colon polyp   TUBAL LIGATION      Prior to Admission medications   Medication Sig Start Date End Date Taking? Authorizing Provider  aspirin EC 81 MG tablet Take 81 mg by mouth daily.   Yes [provider]  lisinopril (ZESTRIL) 10 MG tablet Take 1 tablet (10 mg total) by mouth daily. 10/16/21  Yes Delsa Grana, PA-C  Cholecalciferol (VITAMIN D3 PO) Take by mouth.    [provider]  Cyanocobalamin (VITAMIN B-12 PO) Take 1,000 mcg by mouth 2 (two) times daily.    [provider]  meloxicam (MOBIC) 15 MG tablet Take 1 tablet (15 mg total) by mouth as needed. 02/09/21   Delsa Grana, PA-C  Na Sulfate-K Sulfate-Mg Sulf 17.5-3.13-1.6 GM/177ML SOLN Take by mouth. 09/13/21   [provider]  simvastatin (ZOCOR) 20 MG tablet Take 1 tablet (20 mg total) by mouth at bedtime. 10/03/21   Mecum, Erin E, PA-C    Allergies as of 09/13/2021   (No Known Allergies)    Family  History  Problem Relation Age of Onset   Kidney disease Mother    Hypertension Mother    Cancer Sister        breast   Breast cancer Sister 19   Cancer Brother        colon   Kidney disease Brother    Hypertension Father    Cerebral palsy Brother    Colon cancer Brother    HIV/AIDS Brother    Cancer Other    Breast cancer Other     Social History   Socioeconomic History   Marital status: Married    Spouse name: John   Number of children: 3   Years of education: 12   Highest education level: Some college, no degree  Occupational History   Not on file  Tobacco Use   Smoking status: Former    Types: Cigarettes    Quit date: 04/27/2005    Years since quitting: 16.5   Smokeless tobacco: Never  Vaping Use   Vaping Use: Never used  Substance and Sexual Activity   Alcohol use: Yes    Alcohol/week: 2.0 standard drinks of alcohol    Types: 2 Shots of liquor per week   Drug use: Not Currently    Types: Marijuana    Comment: 1x/mo - last time 06/09/15   Sexual activity: Yes    Birth control/protection: Post-menopausal  Other Topics  Concern   Not on file  Social History Narrative   Not on file   Social Determinants of Health   Financial Resource Strain: Low Risk  (10/16/2021)   Overall Financial Resource Strain (CARDIA)    Difficulty of Paying Living Expenses: Not hard at all  Food Insecurity: No Food Insecurity (10/16/2021)   Hunger Vital Sign    Worried About Running Out of Food in the Last Year: Never true    Ran Out of Food in the Last Year: Never true  Transportation Needs: No Transportation Needs (10/16/2021)   PRAPARE - Hydrologist (Medical): No    Lack of Transportation (Non-Medical): No  Physical Activity: Insufficiently Active (10/16/2021)   Exercise Vital Sign    Days of Exercise per Week: 5 days    Minutes of Exercise per Session: 20 min  Stress: Stress Concern Present (10/16/2021)   [place]    Feeling of Stress : Rather much  Social Connections: Socially Integrated (10/16/2021)   Social Connection and Isolation Panel [NHANES]    Frequency of Communication with Friends and Family: Three times a week    Frequency of Social Gatherings with Friends and Family: Three times a week    Attends Religious Services: More than 4 times per year    Active Member of Clubs or Organizations: Yes    Attends Archivist Meetings: More than 4 times per year    Marital Status: Married  Human resources officer Violence: Not At Risk (10/16/2021)   Humiliation, Afraid, Rape, and Kick questionnaire    Fear of Current or Ex-Partner: No    Emotionally Abused: No    Physically Abused: No    Sexually Abused: No    Review of Systems: See HPI, otherwise negative ROS  Physical Exam: Pulse (!) 58   Temp (!) 96 F (35.6 C) (Temporal)   Resp 16   Ht '5\' 3"'$  (1.6 m)   Wt 117.9 kg   SpO2 100%   BMI 46.06 kg/m  General:   Alert,  pleasant and cooperative in NAD Head:  Normocephalic and atraumatic. Neck:  Supple; no masses or thyromegaly. Lungs:  Clear throughout to auscultation.    Heart:  Regular rate and rhythm. Abdomen:  Soft, nontender and nondistended. Normal bowel sounds, without guarding, and without rebound.   Neurologic:  Alert and  oriented x4;  grossly normal neurologically.  Impression/Plan: Amber Murray is here for an colonoscopy to be performed for family h/o colon cancer  Risks, benefits, limitations, and alternatives regarding  colonoscopy have been reviewed with the patient.  Questions have been answered.  All parties agreeable.   Sherri Sear, MD  10/29/2021, 10:14 AM

## 2021-10-29 NOTE — Anesthesia Postprocedure Evaluation (Signed)
Anesthesia Post Note  Patient: Amber Murray  Procedure(s) Performed: COLONOSCOPY  Patient location during evaluation: Endoscopy Anesthesia Type: General Level of consciousness: awake and alert Pain management: pain level controlled Vital Signs Assessment: post-procedure vital signs reviewed and stable Respiratory status: spontaneous breathing, nonlabored ventilation, respiratory function stable and patient connected to nasal cannula oxygen Cardiovascular status: blood pressure returned to baseline and stable Postop Assessment: no apparent nausea or vomiting Anesthetic complications: no   No notable events documented.   Last Vitals:  Vitals:   10/29/21 1049 10/29/21 1059  BP: 105/70 125/86  Pulse: 61 65  Resp: 18 16  Temp:    SpO2: 100% 100%    Last Pain:  Vitals:   10/29/21 1059  TempSrc:   PainSc: 0-No pain                 Arita Miss

## 2021-10-29 NOTE — Transfer of Care (Signed)
Immediate Anesthesia Transfer of Care Note  Patient: Amber Murray  Procedure(s) Performed: COLONOSCOPY  Patient Location: PACU  Anesthesia Type:General  Level of Consciousness: drowsy  Airway & Oxygen Therapy: Patient Spontanous Breathing  Post-op Assessment: Report given to RN and Post -op Vital signs reviewed and stable  Post vital signs: Reviewed and stable  Last Vitals:  Vitals Value Taken Time  BP    Temp    Pulse    Resp    SpO2      Last Pain:  Vitals:   10/29/21 0926  TempSrc: Temporal         Complications: No notable events documented.

## 2021-10-29 NOTE — Op Note (Signed)
[facility] Gastroenterology Patient Name: Amber Murray Procedure Date: 10/29/2021 10:09 AM MRN: [id number] Account #: [id number] Date of Birth: 1960/10/06 Admit Type: Outpatient Age: 61 Room: [facility] ENDO ROOM 4 Gender: Female Note Status: Finalized Instrument Name: Jasper Riling [id number] Procedure:             Colonoscopy Indications:           Screening in patient at increased risk: Colorectal                         cancer in brother before age 46, Last colonoscopy:                         March 2017 Providers:             Lin Landsman MD, MD Referring MD:          Delsa Grana (Referring MD) Medicines:             General Anesthesia Complications:         No immediate complications. Estimated blood loss: None. Procedure:             Pre-Anesthesia Assessment:                        - Prior to the procedure, a History and Physical was                         performed, and patient medications and allergies were                         reviewed. The patient is competent. The risks and                         benefits of the procedure and the sedation options and                         risks were discussed with the patient. All questions                         were answered and informed consent was obtained.                         Patient identification and proposed procedure were                         verified by the physician, the nurse, the                         anesthesiologist, the anesthetist and the technician                         in the pre-procedure area in the procedure room in the                         endoscopy suite. Mental Status Examination: alert and                         oriented. Airway Examination: normal oropharyngeal  airway and neck mobility. Respiratory Examination:                         clear to auscultation. CV Examination: normal.                         Prophylactic Antibiotics: The patient does  not require                         prophylactic antibiotics. Prior Anticoagulants: The                         patient has taken no previous anticoagulant or                         antiplatelet agents. ASA Grade Assessment: III - A                         patient with severe systemic disease. After reviewing                         the risks and benefits, the patient was deemed in                         satisfactory condition to undergo the procedure. The                         anesthesia plan was to use general anesthesia.                         Immediately prior to administration of medications,                         the patient was re-assessed for adequacy to receive                         sedatives. The heart rate, respiratory rate, oxygen                         saturations, blood pressure, adequacy of pulmonary                         ventilation, and response to care were monitored                         throughout the procedure. The physical status of the                         patient was re-assessed after the procedure.                        After obtaining informed consent, the colonoscope was                         passed under direct vision. Throughout the procedure,                         the patient's blood pressure, pulse, and oxygen  saturations were monitored continuously. The                         Colonoscope was introduced through the anus and                         advanced to the the terminal ileum, with                         identification of the appendiceal orifice and IC                         valve. The colonoscopy was performed without                         difficulty. The patient tolerated the procedure well.                         The quality of the bowel preparation was evaluated                         using the BBPS [facility] Bowel Preparation Scale) with                         scores of: Right Colon = 3, Transverse  Colon = 3 and                         Left Colon = 3 (entire mucosa seen well with no                         residual staining, small fragments of stool or opaque                         liquid). The total BBPS score equals 9. Findings:      The perianal and digital rectal examinations were normal. Pertinent       negatives include normal sphincter tone and no palpable rectal lesions.      The terminal ileum appeared normal.      Non-bleeding external hemorrhoids were found during retroflexion. The       hemorrhoids were medium-sized.      The exam was otherwise without abnormality. Impression:            - The examined portion of the ileum was normal.                        - Non-bleeding external hemorrhoids.                        - The examination was otherwise normal.                        - No specimens collected. Recommendation:        - Discharge patient to home (with escort).                        - Resume previous diet today.                        -  Continue present medications.                        - Repeat colonoscopy in 5 years for screening purposes                         given family h/o colon cancer. Procedure Code(s):     --- Professional ---                        O2947, Colorectal cancer screening; colonoscopy on                         individual at high risk Diagnosis Code(s):     --- Professional ---                        K64.4, Residual hemorrhoidal skin tags                        Z80.0, Family history of malignant neoplasm of                         digestive organs CPT copyright 2019 American Medical Association. All rights reserved. The codes documented in this report are preliminary and upon coder review may  be revised to meet current compliance requirements. Dr. Ulyess Mort Lin Landsman MD, MD 10/29/2021 10:36:15 AM This report has been signed electronically. Number of Addenda: 0 Note Initiated On: 10/29/2021 10:09 AM Scope Withdrawal  Time: 0 hours 9 minutes 52 seconds  Total Procedure Duration: 0 hours 12 minutes 37 seconds  Estimated Blood Loss:  Estimated blood loss: none.      [facility]

## 2021-10-29 NOTE — Anesthesia Preprocedure Evaluation (Signed)
Anesthesia Evaluation  Patient identified by MRN, date of birth, ID band Patient awake    Reviewed: Allergy & Precautions, H&P , NPO status , Patient's Chart, lab work & pertinent test results  History of Anesthesia Complications Negative for: history of anesthetic complications  Airway Mallampati: III  TM Distance: >3 FB Neck ROM: full    Dental no notable dental hx. (+) Teeth Intact   Pulmonary neg pulmonary ROS, neg sleep apnea, neg COPD, Patient abstained from smoking.Not current smoker, former smoker,    Pulmonary exam normal breath sounds clear to auscultation       Cardiovascular Exercise Tolerance: Good METShypertension, Pt. on medications (-) CAD and (-) Past MI Normal cardiovascular exam(-) dysrhythmias  Rhythm:Regular Rate:Normal - Systolic murmurs    Neuro/Psych negative neurological ROS  negative psych ROS   GI/Hepatic GERD  Controlled,(+)     (-) substance abuse  ,   Endo/Other  neg diabetesMorbid obesity  Renal/GU negative Renal ROS     Musculoskeletal   Abdominal (+) + obese,   Peds  Hematology   Anesthesia Other Findings Past Medical History: No date: GERD (gastroesophageal reflux disease)     Comment:  occasional  No date: HLD (hyperlipidemia) No date: Hypertension No date: Obesity 11/13/2015: Prediabetes No date: Vertigo     Comment:  noo episodes in several yrs No date: Wears contact lenses     Comment:  sometimes  Reproductive/Obstetrics                             Anesthesia Physical  Anesthesia Plan  ASA: 3  Anesthesia Plan: General   Post-op Pain Management: Minimal or no pain anticipated   Induction: Intravenous  PONV Risk Score and Plan: 3 and Propofol infusion, TIVA and Ondansetron  Airway Management Planned: Nasal Cannula  Additional Equipment: None  Intra-op Plan:   Post-operative Plan:   Informed Consent: I have reviewed the patients  History and Physical, chart, labs and discussed the procedure including the risks, benefits and alternatives for the proposed anesthesia with the patient or authorized representative who has indicated his/her understanding and acceptance.     Dental advisory given  Plan Discussed with: CRNA  Anesthesia Plan Comments: (Discussed risks of anesthesia with patient, including possibility of difficulty with spontaneous ventilation under anesthesia necessitating airway intervention, PONV, and rare risks such as cardiac or respiratory or neurological events, and allergic reactions. Discussed the role of CRNA in patient's perioperative care. Patient understands. Patient informed about increased incidence of above perioperative risk due to high BMI. Patient understands. )        Anesthesia Quick Evaluation

## 2021-10-30 ENCOUNTER — Encounter: Payer: Self-pay | Admitting: Gastroenterology

## 2022-01-22 ENCOUNTER — Ambulatory Visit: Payer: 59 | Admitting: Family Medicine

## 2022-02-19 ENCOUNTER — Other Ambulatory Visit: Payer: Self-pay | Admitting: Family Medicine

## 2022-02-19 DIAGNOSIS — M79672 Pain in left foot: Secondary | ICD-10-CM

## 2022-03-15 ENCOUNTER — Ambulatory Visit (INDEPENDENT_AMBULATORY_CARE_PROVIDER_SITE_OTHER): Payer: 59 | Admitting: Family Medicine

## 2022-03-15 ENCOUNTER — Encounter: Payer: Self-pay | Admitting: Family Medicine

## 2022-03-15 VITALS — BP 118/78 | HR 89 | Temp 98.4°F | Resp 16 | Ht 65.0 in | Wt 263.9 lb

## 2022-03-15 DIAGNOSIS — I10 Essential (primary) hypertension: Secondary | ICD-10-CM | POA: Diagnosis not present

## 2022-03-15 DIAGNOSIS — E785 Hyperlipidemia, unspecified: Secondary | ICD-10-CM | POA: Diagnosis not present

## 2022-03-15 DIAGNOSIS — M545 Low back pain, unspecified: Secondary | ICD-10-CM | POA: Diagnosis not present

## 2022-03-15 DIAGNOSIS — R7303 Prediabetes: Secondary | ICD-10-CM | POA: Diagnosis not present

## 2022-03-15 DIAGNOSIS — Z1231 Encounter for screening mammogram for malignant neoplasm of breast: Secondary | ICD-10-CM

## 2022-03-15 MED ORDER — PREDNISONE 20 MG PO TABS: 40.0000 mg | ORAL_TABLET | Freq: Every day | ORAL | 0 refills | Status: AC

## 2022-03-15 MED ORDER — LISINOPRIL-HYDROCHLOROTHIAZIDE 20-12.5 MG PO TABS: 1.0000 | ORAL_TABLET | Freq: Every day | ORAL | 1 refills | Status: DC

## 2022-03-15 MED ORDER — METHOCARBAMOL 500 MG PO TABS: 500.0000 mg | ORAL_TABLET | Freq: Two times a day (BID) | ORAL | 0 refills | Status: DC | PRN

## 2022-03-15 MED ORDER — OMEPRAZOLE 40 MG PO CPDR: 40.0000 mg | DELAYED_RELEASE_CAPSULE | Freq: Two times a day (BID) | ORAL | 3 refills | Status: DC

## 2022-03-15 NOTE — Progress Notes (Unsigned)
Patient ID: Amber Murray, female    DOB: 1961-04-07, 61 y.o.   MRN: [id number]  PCP: Delsa Grana, PA-C  Chief Complaint  Patient presents with   Leg Pain    Right leg pain onset for 3 days   Tingling    Toes only    Subjective:   Amber Murray is a 61 y.o. 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      Patient Active Problem List   Diagnosis Date Noted   Hyperlipidemia 09/21/2021   Essential hypertension, benign 11/14/2015   Prediabetes 11/13/2015   Benign neoplasm of rectosigmoid junction    Family history of breast cancer 04/28/2015   Family history of colon cancer 04/28/2015   Obesity, Class III, BMI 40-49.9 (morbid obesity) ([facility]) 04/28/2015   Need for diphtheria-tetanus-pertussis (Tdap) vaccine, adult/adolescent 04/28/2015      Current Outpatient Medications:    aspirin EC 81 MG tablet, Take 81 mg by mouth daily., Disp: , Rfl:    Cholecalciferol (VITAMIN D3 PO), Take by mouth., Disp: , Rfl:    Cyanocobalamin (VITAMIN B-12 PO), Take 1,000 mcg by mouth 2 (two) times daily., Disp: , Rfl:    lisinopril-hydrochlorothiazide (ZESTORETIC) 20-12.5 MG tablet, Take 1 tablet by mouth daily., Disp: , Rfl:    meloxicam (MOBIC) 15 MG tablet, TAKE 1 TABLET BY MOUTH AS NEEDED, Disp: 30 tablet, Rfl: 0   lisinopril (ZESTRIL) 10 MG tablet, Take 1 tablet (10 mg total) by mouth daily. (Patient not taking: Reported on 03/15/2022), Disp: 90 tablet, Rfl: 3   simvastatin (ZOCOR) 20 MG tablet, Take 1 tablet (20 mg total) by mouth  at bedtime. (Patient not taking: Reported on 03/15/2022), Disp: 60 tablet, Rfl: 0   Allergies  Allergen Reactions   Hydrochlorothiazide Other (See Comments)    Severe muscle spasms and cramps on low dose 12.5     Social History   Tobacco Use   Smoking status: Former    Types: Cigarettes    Quit date: 04/27/2005    Years since quitting: 16.8   Smokeless tobacco: Never  Vaping Use   Vaping Use: Never used  Substance Use Topics   Alcohol use: Yes    Alcohol/week: 2.0 standard drinks of alcohol    Types: 2 Shots of liquor per week   Drug use: Not Currently    Types: Marijuana    Comment: 1x/mo - last time 06/09/15      Chart Review Today: I personally reviewed active problem list, medication list, allergies, family history, social history, health maintenance, notes from last encounter, lab results, imaging with the patient/caregiver today.   Review of Systems  Constitutional: Negative.   HENT: Negative.    Eyes: Negative.   Respiratory: Negative.    Cardiovascular: Negative.   Gastrointestinal: Negative.   Endocrine: Negative.   Genitourinary: Negative.   Musculoskeletal: Negative.   Skin: Negative.   Allergic/Immunologic: Negative.   Neurological: Negative.   Hematological: Negative.   Psychiatric/Behavioral: Negative.    All other systems reviewed and are negative.  Objective:   Vitals:   03/15/22 1119  BP: 118/78  Pulse: 89  Resp: 16  Temp: 98.4 F (36.9 C)  TempSrc: Oral  SpO2: 98%  Weight: 263 lb 14.4 oz (119.7 kg)  Height: _0  (1.651 m)    Body mass index is 43.92 kg/m.  Physical Exam Vitals and nursing note reviewed.  Constitutional:      General: She is not in acute distress.    Appearance: Normal appearance. She is obese. She is not ill-appearing, toxic-appearing or diaphoretic.  HENT:     Head: Normocephalic and atraumatic.  Cardiovascular:     Rate and Rhythm: Normal rate and regular rhythm.     Pulses: Normal pulses.     Heart  sounds: Normal heart sounds.  Pulmonary:     Effort: Pulmonary effort is normal.     Breath sounds: Normal breath sounds.  Musculoskeletal:     Cervical back: Normal. Normal range of motion.     Thoracic back: Normal. No tenderness or bony tenderness. Normal range of motion.     Lumbar back: Negative right straight leg raise test and negative left straight leg raise test.     Right hip: No tenderness, bony tenderness or crepitus. Normal range of motion.     Comments: No midline tenderness from cervical to lumbar spine, no step off No paraspinal muscle ttp from cervical to lumbar spine Grossly normal ROM of back 5/5 strength bilaterally with dorsiflexion, plantarflexion, flexion and extension at knees, and flexion and extension at hips Grossly normal sensation to light touch to bilateral lower extremities    Skin:    General: Skin is warm.     Coloration: Skin is not jaundiced or pale.  Neurological:     Mental Status: She is alert. Mental status is at baseline.     Gait: Gait abnormal (antalgic gait).  Psychiatric:        Mood and Affect: Mood normal.      Results for orders placed or performed in visit on 09/13/21  COMPLETE METABOLIC PANEL WITH GFR  Result Value Ref Range   Glucose, Bld 98 65 - 99 mg/dL   BUN 14 7 - 25 mg/dL   Creat 0.71 0.50 - 1.05 mg/dL   eGFR 97 > OR = 60 mL/min/1.36m   BUN/Creatinine Ratio NOT APPLICABLE 6 - 22 (calc)   Sodium 139 135 - 146 mmol/L   Potassium 4.6 3.5 - 5.3 mmol/L   Chloride 105 98 - 110 mmol/L   CO2 27 20 - 32 mmol/L   Calcium 9.6 8.6 - 10.4 mg/dL   Total Protein 7.4 6.1 - 8.1 g/dL   Albumin 4.5 3.6 - 5.1 g/dL   Globulin 2.9 1.9 - 3.7 g/dL (calc)   AG Ratio 1.6 1.0 - 2.5 (calc)   Total Bilirubin 0.4 0.2 - 1.2 mg/dL   Alkaline phosphatase (APISO) 99 37 - 153 U/L   AST 17 10 - 35 U/L   ALT 14 6 - 29 U/L  Hemoglobin A1c  Result Value Ref Range   Hgb A1c MFr Bld 5.5 <5.7 % of total Hgb   Mean Plasma Glucose 111 mg/dL   eAG  (mmol/L) 6.2 mmol/L  Lipid panel  Result Value Ref Range   Cholesterol 224 (H) <200 mg/dL   HDL 60 > OR = 50 mg/dL   Triglycerides 89 <150 mg/dL   LDL Cholesterol (Calc) 145 (H) mg/dL (calc)   Total CHOL/HDL Ratio 3.7 <5.0 (calc)   Non-HDL Cholesterol (Calc) 164 (  H) <130 mg/dL (calc)       Assessment & Plan:     ICD-10-CM   1. Acute right-sided low back pain, unspecified whether sciatica present  M54.50 predniSONE (DELTASONE) 20 MG tablet    methocarbamol (ROBAXIN) 500 MG tablet    Ambulatory referral to Physical Therapy   right low back radiating to leg, grossly normal exam 5/5 strength and normal sensation to light touch, sx sound radicular tx steroids nsaids heat    2. Essential hypertension, benign  I10 lisinopril-hydrochlorothiazide (ZESTORETIC) 20-12.5 MG tablet    lisinopril-hydrochlorothiazide (ZESTORETIC) 20-12.5 MG tablet    COMPLETE METABOLIC PANEL WITH GFR   pt has changed her BP meds at home to what she was previously taking w/o SE, bp at goal today, rx sent in    3. Hyperlipidemia, unspecified hyperlipidemia type  I33.8 COMPLETE METABOLIC PANEL WITH GFR    Lipid panel   due for labs    4. Prediabetes  S50.53 COMPLETE METABOLIC PANEL WITH GFR   hx, will do A1C with next labs    5. Encounter for screening mammogram for breast cancer  Z12.31 MM 3D SCREEN BREAST BILATERAL   due    6. Obesity, Class III, BMI 40-49.9 (morbid obesity) ([facility])  E66.01    likely exacerbating stress on joints/back with increased physical demand recently      Plan for pt's back on AVS and reviewed with pt at time of exam/OV: Do high dose of NSAIDs - ibuprofen 600-800 mg three times a day or max dose of aleve or naproxen twice a day for the next several days and protect your stomach with prilosec twice a day   Sx have only been x 3 d - xrays indicated if ongoing more than a month - or if she really was having trouble with weight bearing - this may be a flare of sciatica But low threshold  to get low back and hip xrays Tx for lumbosacral radiculopathy - but if no improvement I have strongly recommended to the pt that she do PT evaluation and treatment.  Note given to excuse from 12/8 to 12/11, she thinks she will be able to return then. F/up in clinic if not improving    Delsa Grana, PA-C 03/15/22 11:26 AM

## 2022-03-15 NOTE — Patient Instructions (Addendum)
[facility] [address], [place], [place] [address] Scheduling phone #: [phone]   Do high dose of NSAIDs - ibuprofen 600-800 mg three times a day or max dose of aleve or naproxen twice a day for the next several days and protect your stomach with prilosec twice a day   Let me know if there is no improvement and I can send in orders for physical therapy or adjust your work notes

## 2022-03-16 LAB — LIPID PANEL
Cholesterol: 192 mg/dL (ref <200)
HDL: 58 mg/dL (ref >=50)
LDL Cholesterol (Calc): 114 mg/dL (calc) — ABNORMAL HIGH
Non-HDL Cholesterol (Calc): 134 mg/dL (calc) — ABNORMAL HIGH (ref <130)
Total CHOL/HDL Ratio: 3.3 (calc) (ref <5.0)
Triglycerides: 92 mg/dL (ref <150)

## 2022-03-16 LAB — COMPLETE METABOLIC PANEL WITH GFR
AG Ratio: 1.6 (calc) (ref 1.0–2.5)
ALT: 15 U/L (ref 6–29)
AST: 22 U/L (ref 10–35)
Albumin: 4.2 g/dL (ref 3.6–5.1)
Alkaline phosphatase (APISO): 80 U/L (ref 37–153)
BUN: 19 mg/dL (ref 7–25)
CO2: 25 mmol/L (ref 20–32)
Calcium: 9.6 mg/dL (ref 8.6–10.4)
Chloride: 105 mmol/L (ref 98–110)
Creat: 0.8 mg/dL (ref 0.50–1.05)
Globulin: 2.6 g/dL (calc) (ref 1.9–3.7)
Glucose, Bld: 93 mg/dL (ref 65–99)
Potassium: 4 mmol/L (ref 3.5–5.3)
Sodium: 139 mmol/L (ref 135–146)
Total Bilirubin: 0.3 mg/dL (ref 0.2–1.2)
Total Protein: 6.8 g/dL (ref 6.1–8.1)
eGFR: 84 mL/min/{1.73_m2} (ref >=60)

## 2022-03-18 ENCOUNTER — Telehealth: Payer: Self-pay | Admitting: Family Medicine

## 2022-03-18 NOTE — Telephone Encounter (Signed)
Copied from [place] [id number] [phone]. Topic: General - Inquiry >> Mar 18, 2022  2:54 PM Teressa P wrote: Pt called saying she needs a new note for for having her out of work for the sciatica nerve flair up.  She please call when it is ready. Please call her when it is ready [phone]

## 2022-03-19 ENCOUNTER — Ambulatory Visit: Payer: Self-pay

## 2022-03-19 NOTE — Telephone Encounter (Signed)
Pt notified and she verbalizes her note given until 03/20/2022 is not going to work out for her due to still having tingling and pain.

## 2022-03-19 NOTE — Telephone Encounter (Signed)
  Chief Complaint: Allergic reaction Symptoms: Facial swelling. Eyes swollen nose swollen - cannot breathe through nose. Welts. Frequency: 30 minutes Pertinent Negatives: Patient denies Inability to breathe Disposition: '[x]'$ ED /'[]'$ Urgent Care (no appt availability in office) / '[]'$ Appointment(In office/virtual)/ '[]'$  [place] [facility]/ '[]'$ Home Care/ '[]'$ Refused Recommended Disposition /'[]'$ [place] [facility]/ '[]'$  Follow-up with PCP Additional Notes: PT states that she had an allergic reaction earlier in the week that was not as severe. Pt does not know  what is causing the reaction. PT took benadryl and will go to the ED.  Reason for Disposition  [1] Widespread hives, itching or facial swelling AND [2] onset < 2 hours of exposure to high-risk allergen (e.g., sting, nuts, 1st dose of antibiotic)  Answer Assessment - Initial Assessment Questions 1. SYMPTOMS: "What is the most serious symptom?"     Facial swelling 2. AIRWAY: "Are they breathing?" (e.g., Yes, No)  (R/O: airway blockage, respiratory arrest)      Yes - can't breath through nose 3. BREATHING: "Is there difficulty breathing?" (e.g., Yes, No; wheezing, unable to complete a sentence)  (R/O: respiratory distress)     no 4. CIRCULATION: "Are you feeling weak?"  (e.g., Yes, No, Unknown; severity)  (R/O: shock) If Yes, ask: "Can you stand and walk normally?"      5. SWALLOWING: "Can you swallow?" (e.g.,Yes, No; food, fluid, saliva)     yes 6. ONSET: "When did the reaction start?" (Minutes or hours ago)      30 minutes ago 7. SUBSTANCE: "What are you reacting to?" "When did the contact occur?"      unsure 8. PREVIOUS REACTION: "Have you ever reacted to it before?" If Yes, ask: "What happened that time?"     Unsure had a reaction earlier this week not as severe 9. EPINEPHRINE: "Do you have an epinephrine autoinjector (e.g., EpiPen)?"     no  Protocols used: Anaphylaxis-A-AH

## 2022-03-20 ENCOUNTER — Encounter: Payer: Self-pay | Admitting: Nurse Practitioner

## 2022-03-20 ENCOUNTER — Ambulatory Visit (INDEPENDENT_AMBULATORY_CARE_PROVIDER_SITE_OTHER): Payer: 59 | Admitting: Nurse Practitioner

## 2022-03-20 ENCOUNTER — Ambulatory Visit: Payer: Self-pay

## 2022-03-20 ENCOUNTER — Other Ambulatory Visit: Payer: Self-pay

## 2022-03-20 VITALS — BP 124/76 | HR 87 | Temp 98.4°F | Resp 18 | Ht 66.0 in | Wt 276.1 lb

## 2022-03-20 DIAGNOSIS — T7840XA Allergy, unspecified, initial encounter: Secondary | ICD-10-CM | POA: Diagnosis not present

## 2022-03-20 MED ORDER — FAMOTIDINE 20 MG PO TABS: 20.0000 mg | ORAL_TABLET | Freq: Two times a day (BID) | ORAL | 0 refills | Status: DC

## 2022-03-20 NOTE — Progress Notes (Signed)
BP 124/76   Pulse 87   Temp 98.4 F (36.9 C) (Oral)   Resp 18   Ht _0  (1.676 m)   Wt 276 lb 1.6 oz (125.2 kg)   SpO2 98%   BMI 44.56 kg/m    Subjective:    Patient ID: Amber Murray, female    DOB: Jan 22, 1961, 61 y.o.   MRN: [id number]  HPI: Amber Murray is a 61 y.o. female  Chief Complaint  Patient presents with   Allergic Reaction    To medication   Eye Problem   Allergic reaction: patient reports she has a rash, and swollen eyes started Saturday then it went away and returned on Monday.  She reports that the swelling and rash were worse yesterday.  She says she is much improved today. She reports no new soaps or detergents,  she does report she started a new medications on Friday.  She says she started omeprazole, robaxin and prednisone.   She has continued to take the medication. She says she has finished the prednisone.  She says she has continued to take the robaxin and it has not gotten any worse so she does not think that is the cause.  She says that it is getting better.  She denies any shortness of breath, throat  closing today. She says she did feel like her throat was closing yesterday. But she says that went away after taking benadryl. Discussed adding on pepcid.  Will also send to allergist for testing.   Relevant past medical, surgical, family and social history reviewed and updated as indicated. Interim medical history since our last visit reviewed. Allergies and medications reviewed and updated.  Review of Systems  Constitutional: Negative for fever or weight change.  Respiratory: Negative for cough and shortness of breath.   Cardiovascular: Negative for chest pain or palpitations.  Gastrointestinal: Negative for abdominal pain, no bowel changes.  Musculoskeletal: Negative for gait problem or joint swelling.  Skin: positive for rash.  Neurological: Negative for dizziness or headache.  No other specific complaints in a complete review of  systems (except as listed in HPI above).      Objective:    BP 124/76   Pulse 87   Temp 98.4 F (36.9 C) (Oral)   Resp 18   Ht _1  (1.676 m)   Wt 276 lb 1.6 oz (125.2 kg)   SpO2 98%   BMI 44.56 kg/m   Wt Readings from Last 3 Encounters:  03/20/22 276 lb 1.6 oz (125.2 kg)  03/15/22 263 lb 14.4 oz (119.7 kg)  10/29/21 260 lb (117.9 kg)    Physical Exam  Constitutional: Patient appears well-developed and well-nourished. Obese  No distress.  HEENT: head atraumatic, normocephalic, pupils equal and reactive to light, neck supple, throat within normal limits Cardiovascular: Normal rate, regular rhythm and normal heart sounds.  No murmur heard. No BLE edema. Pulmonary/Chest: Effort normal and breath sounds normal. No respiratory distress. Abdominal: Soft.  There is no tenderness. Skin: hives noted on abdomen Psychiatric: Patient has a normal mood and affect. behavior is normal. Judgment and thought content normal.   Results for orders placed or performed in visit on 03/15/22  COMPLETE METABOLIC PANEL WITH GFR  Result Value Ref Range   Glucose, Bld 93 65 - 99 mg/dL   BUN 19 7 - 25 mg/dL   Creat 0.80 0.50 - 1.05 mg/dL   eGFR 84 > OR = 60 mL/min/1.38m   BUN/Creatinine Ratio SEE NOTE: 6 -  22 (calc)   Sodium 139 135 - 146 mmol/L   Potassium 4.0 3.5 - 5.3 mmol/L   Chloride 105 98 - 110 mmol/L   CO2 25 20 - 32 mmol/L   Calcium 9.6 8.6 - 10.4 mg/dL   Total Protein 6.8 6.1 - 8.1 g/dL   Albumin 4.2 3.6 - 5.1 g/dL   Globulin 2.6 1.9 - 3.7 g/dL (calc)   AG Ratio 1.6 1.0 - 2.5 (calc)   Total Bilirubin 0.3 0.2 - 1.2 mg/dL   Alkaline phosphatase (APISO) 80 37 - 153 U/L   AST 22 10 - 35 U/L   ALT 15 6 - 29 U/L  Lipid panel  Result Value Ref Range   Cholesterol 192 <200 mg/dL   HDL 58 > OR = 50 mg/dL   Triglycerides 92 <150 mg/dL   LDL Cholesterol (Calc) 114 (H) mg/dL (calc)   Total CHOL/HDL Ratio 3.3 <5.0 (calc)   Non-HDL Cholesterol (Calc) 134 (H) <130 mg/dL (calc)       Assessment & Plan:   Problem List Items Addressed This Visit   None Visit Diagnoses     Allergic reaction, initial encounter    -  Primary   can continue to take benadryl or may take zyrtec, start pepcid, referral placed to allergy for allergy testing.   Relevant Medications   famotidine (PEPCID) 20 MG tablet   Other Relevant Orders   Ambulatory referral to Allergy        Follow up plan: Return if symptoms worsen or fail to improve.

## 2022-03-20 NOTE — Telephone Encounter (Signed)
  Chief Complaint: eyes swollen rash to forearms Symptoms: red rash to upper arms and stomach and thighs Frequency: Saturday Pertinent Negatives: Patient denies itching, SOB, difficulty swallowing, moth swelling Disposition: '[]'$ ED /'[]'$ Urgent Care (no appt availability in office) / '[x]'$ Appointment(In office/virtual)/ '[]'$  [place] [facility]/ '[]'$ Home Care/ '[]'$ Refused Recommended Disposition /'[]'$ [place] [facility]/ '[]'$  Follow-up with PCP Additional Notes: appt this am in office Reason for Disposition  Face or lip swelling  Answer Assessment - Initial Assessment Questions 1. APPEARANCE of RASH: "Describe the rash." (e.g., spots, blisters, raised areas, skin peeling, scaly)     Red  2. SIZE: "How big are the spots?" (e.g., tip of pen, eraser, coin; inches, centimeters)     *No Answer* 3. LOCATION: "Where is the rash located?"     Upper arms and stomach , thighs 4. COLOR: "What color is the rash?" (Note: It is difficult to assess rash color in people with darker-colored skin. When this situation occurs, simply ask the caller to describe what they see.)     red 5. ONSET: "When did the rash begin?"     Saturday 6. FEVER: "Do you have a fever?" If Yes, ask: "What is your temperature, how was it measured, and when did it start?"     no 7. ITCHING: "Does the rash itch?" If Yes, ask: "How bad is the itch?" (Scale 1-10; or mild, moderate, severe)     none 8. CAUSE: "What do you think is causing the rash?"     med 9. NEW MEDICINES: "What new medicines are you taking?" (e.g., name of antibiotic) "When did you start taking this medication?".     3 new meds 10. OTHER SYMPTOMS: "Do you have any other symptoms?" (e.g., sore throat, fever, joint pain)       Eyes swollen halfway shut , 11. PREGNANCY: "Is there any chance you are pregnant?" "When was your last menstrual period?"       N/a  Protocols used: Rash - Widespread On Drugs-A-AH

## 2022-03-25 ENCOUNTER — Telehealth: Payer: Self-pay

## 2022-03-25 NOTE — Telephone Encounter (Signed)
Called pt no answer left vm to return call. Is FMLA paperwork on her back pain? Or what issues?

## 2022-03-25 NOTE — Telephone Encounter (Signed)
Pt returned call, please advise  

## 2022-03-25 NOTE — Telephone Encounter (Signed)
Spoke to pt she states its for her back pain and I have told her I will discuss with Kristeen Miss if she needs an appointment for this specifically. Paperwork on your desk

## 2022-03-26 NOTE — Telephone Encounter (Signed)
Pt is coming in on 12/27

## 2022-03-26 NOTE — Telephone Encounter (Signed)
Can you call pt to schedule please and thanks

## 2022-04-03 ENCOUNTER — Encounter: Payer: Self-pay | Admitting: Family Medicine

## 2022-04-03 ENCOUNTER — Ambulatory Visit (INDEPENDENT_AMBULATORY_CARE_PROVIDER_SITE_OTHER): Payer: 59 | Admitting: Family Medicine

## 2022-04-03 VITALS — BP 126/80 | HR 92 | Resp 16 | Ht 66.0 in | Wt 276.0 lb

## 2022-04-03 DIAGNOSIS — M545 Low back pain, unspecified: Secondary | ICD-10-CM | POA: Diagnosis not present

## 2022-04-03 DIAGNOSIS — M25551 Pain in right hip: Secondary | ICD-10-CM | POA: Diagnosis not present

## 2022-04-03 DIAGNOSIS — Z0289 Encounter for other administrative examinations: Secondary | ICD-10-CM | POA: Diagnosis not present

## 2022-04-03 MED ORDER — MELOXICAM 15 MG PO TABS: 15.0000 mg | ORAL_TABLET | ORAL | 2 refills | Status: DC | PRN

## 2022-04-03 NOTE — Progress Notes (Signed)
Name: Amber Murray   MRN: [id number]    DOB: Feb 21, 1961   Date:04/03/2022       Progress Note  Chief Complaint  Patient presents with   FMLA Paperwork    Needs paperwork to cover for physical therapy/PCP appointments until back pain resolved.      Subjective:   Amber Murray is a 61 y.o. female, presents to clinic for [facility] paperwork   Most recent office visit was for acute low back pain, seen in this office on 12/8 She was given 5-day burst of prednisone, muscle relaxers, NSAIDs and a work note to excuse her for several days of work over the weekend and the beginning of the next week She reports that she was unable to return to work the next week and continue to have severe pain pain was worse with standing walking or laying down it was slightly relieved with sitting She ended up going to [place] [facility] the following week and continued to be out of work she got more steroids and a different muscle relaxer and also tramadol pain medicine She was able to return on 12/20 and 12/21  She continues to have pain and puts most of her weight on on her left side to avoid increasing the pain on the right side to her low back and right hip She does need her FMLA paperwork completed so that she can schedule PT for further treatment and evaluation         Current Outpatient Medications:    aspirin EC 81 MG tablet, Take 81 mg by mouth daily., Disp: , Rfl:    Cholecalciferol (VITAMIN D3 PO), Take by mouth., Disp: , Rfl:    Cyanocobalamin (VITAMIN B-12 PO), Take 1,000 mcg by mouth 2 (two) times daily., Disp: , Rfl:    famotidine (PEPCID) 20 MG tablet, Take 1 tablet (20 mg total) by mouth 2 (two) times daily., Disp: 30 tablet, Rfl: 0   lisinopril-hydrochlorothiazide (ZESTORETIC) 20-12.5 MG tablet, Take 1 tablet by mouth daily., Disp: 90 tablet, Rfl: 1   omeprazole (PRILOSEC) 40 MG capsule, Take 1 capsule (40 mg total) by mouth 2 (two) times daily. For GI protection  while taking NSAIDs, Disp: 60 capsule, Rfl: 3   meloxicam (MOBIC) 15 MG tablet, Take 1 tablet (15 mg total) by mouth as needed., Disp: 90 tablet, Rfl: 2   simvastatin (ZOCOR) 20 MG tablet, Take 1 tablet (20 mg total) by mouth at bedtime. (Patient not taking: Reported on 03/15/2022), Disp: 60 tablet, Rfl: 0  Patient Active Problem List   Diagnosis Date Noted   Hyperlipidemia 09/21/2021   Essential hypertension, benign 11/14/2015   Prediabetes 11/13/2015   Benign neoplasm of rectosigmoid junction    Family history of breast cancer 04/28/2015   Family history of colon cancer 04/28/2015   Obesity, Class III, BMI 40-49.9 (morbid obesity) ([place]) 04/28/2015   Need for diphtheria-tetanus-pertussis (Tdap) vaccine, adult/adolescent 04/28/2015    Past Surgical History:  Procedure Laterality Date   CHOLECYSTECTOMY     COLONOSCOPY N/A 10/29/2021   Procedure: COLONOSCOPY;  Surgeon: Lin Landsman, MD;  Location: [facility] ENDOSCOPY;  Service: Gastroenterology;  Laterality: N/A;   COLONOSCOPY WITH PROPOFOL N/A 06/16/2015   Procedure: COLONOSCOPY WITH PROPOFOL;  Surgeon: Lucilla Lame, MD;  Location: [place];  Service: Endoscopy;  Laterality: N/A;   POLYPECTOMY  06/16/2015   Procedure: POLYPECTOMY INTESTINAL;  Surgeon: Lucilla Lame, MD;  Location: [place];  Service: Endoscopy;;  Sigmoid colon polyp   TUBAL  LIGATION      Family History  Problem Relation Age of Onset   Kidney disease Mother    Hypertension Mother    Cancer Sister        breast   Breast cancer Sister 73   Cancer Brother        colon   Kidney disease Brother    Hypertension Father    Cerebral palsy Brother    Colon cancer Brother    HIV/AIDS Brother    Cancer Other    Breast cancer Other     Social History   Tobacco Use   Smoking status: Former    Types: Cigarettes    Quit date: 04/27/2005    Years since quitting: 16.9   Smokeless tobacco: Never  Vaping Use   Vaping Use: Never used  Substance Use  Topics   Alcohol use: Yes    Alcohol/week: 2.0 standard drinks of alcohol    Types: 2 Shots of liquor per week   Drug use: Not Currently    Types: Marijuana    Comment: 1x/mo - last time 06/09/15     No Active Allergies  Health Maintenance  Topic Date Due   MAMMOGRAM  07/26/2021   COVID-19 Vaccine (3 - 2023-24 season) 12/07/2021   Zoster Vaccines- Shingrix (1 of 2) 06/14/2022 (Originally 08/21/2010)   INFLUENZA VACCINE  07/07/2022 (Originally 11/06/2021)   PAP SMEAR-Modifier  06/01/2023   DTaP/Tdap/Td (2 - Td or Tdap) 04/27/2025   DEXA SCAN  01/02/2026   COLONOSCOPY (Pts 45-78yr Insurance coverage will need to be confirmed)  10/30/2026   Hepatitis C Screening  Completed   HIV Screening  Completed   HPV VACCINES  Aged Out    Chart Review Today: I personally reviewed active problem list, medication list, allergies, family history, social history, health maintenance, notes from last encounter, lab results, imaging with the patient/caregiver today.   Review of Systems  Constitutional: Negative.   HENT: Negative.    Eyes: Negative.   Respiratory: Negative.    Cardiovascular: Negative.   Gastrointestinal: Negative.   Endocrine: Negative.   Genitourinary: Negative.   Musculoskeletal: Negative.   Skin: Negative.   Allergic/Immunologic: Negative.   Neurological: Negative.   Hematological: Negative.   Psychiatric/Behavioral: Negative.    All other systems reviewed and are negative.    Objective:   Vitals:   04/03/22 1358  BP: 126/80  Pulse: 92  Resp: 16  SpO2: 95%  Weight: 276 lb (125.2 kg)  Height: '5\' 6"'$  (1.676 m)    Body mass index is 44.55 kg/m.  Physical Exam Vitals and nursing note reviewed.  Constitutional:      General: She is not in acute distress.    Appearance: Normal appearance. She is well-developed. She is obese. She is not ill-appearing, toxic-appearing or diaphoretic.  HENT:     Head: Normocephalic and atraumatic.     Nose: Nose normal.  Eyes:      General:        Right eye: No discharge.        Left eye: No discharge.     Conjunctiva/sclera: Conjunctivae normal.  Neck:     Trachea: No tracheal deviation.  Cardiovascular:     Rate and Rhythm: Normal rate and regular rhythm.  Pulmonary:     Effort: Pulmonary effort is normal. No respiratory distress.     Breath sounds: No stridor.  Musculoskeletal:        General: Normal range of motion.     Comments: No  midline tenderness from cervical to lumbar spine, no step off No paraspinal muscle ttp from cervical to lumbar spine  Grossly normal ROM of back 5/5 strength bilaterally with dorsiflexion, plantarflexion,  Grossly normal sensation to light touch to bilateral lower extremities Antalgic gait  Skin:    General: Skin is warm and dry.     Findings: No rash.  Neurological:     Mental Status: She is alert.     Motor: No abnormal muscle tone.     Coordination: Coordination normal.  Psychiatric:        Behavior: Behavior normal.         Assessment & Plan:   Problem List Items Addressed This Visit   None Visit Diagnoses     Acute right-sided low back pain, unspecified whether sciatica present    -  Primary   low back with sciatica features previously, after two tx of steroids its a little better, she is pending ortho/pt eval   Relevant Medications   meloxicam (MOBIC) 15 MG tablet She did not like robaxin - caused her insomnia Did get baclofen from [facility] [facility] She did higher dose of prednisone last week (60, 50, ... Taper pack) and that seemed to help They referred to ortho - unclear if she is going to do ortho or PT - our referral was cancelled With pain only 2 d at the time of her initial visit with me 12/8 - no injury - at that time we did not do xrays - with pain ongoing for the rest of the month xrays are indicated - they did hip x[facility] at [facility] - would do lumbar spine Can defer to ortho if she is going to see them - if she is only going to do PT then  I would complete the lumbar xray at outpt imaging at her convenience  Sx much better, but still abnormal gait, some pain ad numbness Reviewed that she can continue mobic or other NSAIDs Continue baclofen as needed Heat tx PT strongly recommended    Other Relevant Orders   DG Lumbar Spine Complete   Right hip pain       she did get right hip x[facility] at [facility] which I can see provider noted was neg - pending ortho/pt   Relevant Medications   meloxicam (MOBIC) 15 MG tablet   Other Relevant Orders   DG Lumbar Spine Complete   Encounter for completion of form with patient       FMLA forms completed to excuse from flare 03/15/2022-03/26/2022 and interittent for PT tx and possible future flares       F/up as needed  Delsa Grana, PA-C 04/03/22 2:21 PM

## 2022-06-28 ENCOUNTER — Ambulatory Visit
Admission: RE | Admit: 2022-06-28 | Discharge: 2022-06-28 | Disposition: A | Discharge status: Home / Self Care | Payer: 59 | Source: Ambulatory Visit | Attending: Family Medicine | Admitting: Family Medicine

## 2022-06-28 DIAGNOSIS — Z1231 Encounter for screening mammogram for malignant neoplasm of breast: Secondary | ICD-10-CM | POA: Insufficient documentation

## 2022-08-15 ENCOUNTER — Ambulatory Visit (INDEPENDENT_AMBULATORY_CARE_PROVIDER_SITE_OTHER): Payer: 59 | Admitting: Family Medicine

## 2022-08-15 ENCOUNTER — Encounter: Payer: Self-pay | Admitting: Family Medicine

## 2022-08-15 VITALS — BP 122/86 | HR 91 | Temp 98.0°F | Resp 18 | Ht 66.0 in | Wt 266.9 lb

## 2022-08-15 DIAGNOSIS — I10 Essential (primary) hypertension: Secondary | ICD-10-CM

## 2022-08-15 DIAGNOSIS — J020 Streptococcal pharyngitis: Secondary | ICD-10-CM

## 2022-08-15 DIAGNOSIS — J029 Acute pharyngitis, unspecified: Secondary | ICD-10-CM | POA: Diagnosis not present

## 2022-08-15 DIAGNOSIS — J069 Acute upper respiratory infection, unspecified: Secondary | ICD-10-CM

## 2022-08-15 LAB — POCT RAPID STREP A (OFFICE): Rapid Strep A Screen: POSITIVE — AB

## 2022-08-15 MED ORDER — LORATADINE 10 MG PO TABS: 10.0000 mg | ORAL_TABLET | Freq: Every day | ORAL | 11 refills | Status: DC

## 2022-08-15 MED ORDER — AMOXICILLIN 500 MG PO TABS: 500.0000 mg | ORAL_TABLET | Freq: Two times a day (BID) | ORAL | 0 refills | Status: AC

## 2022-08-15 NOTE — Progress Notes (Signed)
Patient ID: Amber Murray, female    DOB: 12-04-1960, 62 y.o.   MRN: [id number]  PCP: Danelle Berry, PA-C  Chief Complaint  Patient presents with   Hypertension    Bp has been running high at home since Tuesday. Pt states has been taking extra doses still bp high at home.   Headache    Subjective:   Amber Murray is a 62 y.o. female, presents to clinic with CC of the following:  HPI Here for high/uncontrolled BP all week Had a party/some heavy drinking this past weekend and then BP increased at home, some scant HA sx Home BP 150/100's, today 130/90 She has taken 1.5 of her BP meds on Tuesday, and then took it BID Wednesday She is on lisinopril-HCTZ 20-12.5 daily and previously was well controlled  ST - mentioned at end of visit - Sore throat for a while, congestion in throat and voice change No fever     Patient Active Problem List   Diagnosis Date Noted   Hyperlipidemia 09/21/2021   Essential hypertension, benign 11/14/2015   Prediabetes 11/13/2015   Benign neoplasm of rectosigmoid junction    Family history of breast cancer 04/28/2015   Family history of colon cancer 04/28/2015   Obesity, Class III, BMI 40-49.9 (morbid obesity) (HCC) 04/28/2015   Need for diphtheria-tetanus-pertussis (Tdap) vaccine, adult/adolescent 04/28/2015      Current Outpatient Medications:    aspirin EC 81 MG tablet, Take 81 mg by mouth daily., Disp: , Rfl:    Cholecalciferol (VITAMIN D3 PO), Take by mouth., Disp: , Rfl:    Cyanocobalamin (VITAMIN B-12 PO), Take 1,000 mcg by mouth 2 (two) times daily., Disp: , Rfl:    famotidine (PEPCID) 20 MG tablet, Take 1 tablet (20 mg total) by mouth 2 (two) times daily., Disp: 30 tablet, Rfl: 0   lisinopril-hydrochlorothiazide (ZESTORETIC) 20-12.5 MG tablet, Take 1 tablet by mouth daily., Disp: 90 tablet, Rfl: 1   meloxicam (MOBIC) 15 MG tablet, Take 1 tablet (15 mg total) by mouth as needed., Disp: 90 tablet, Rfl: 2   omeprazole  (PRILOSEC) 40 MG capsule, Take 1 capsule (40 mg total) by mouth 2 (two) times daily. For GI protection while taking NSAIDs, Disp: 60 capsule, Rfl: 3   simvastatin (ZOCOR) 20 MG tablet, Take 1 tablet (20 mg total) by mouth at bedtime., Disp: 60 tablet, Rfl: 0   No Active Allergies   Social History   Tobacco Use   Smoking status: Former    Types: Cigarettes    Quit date: 04/27/2005    Years since quitting: 17.3   Smokeless tobacco: Never  Vaping Use   Vaping Use: Never used  Substance Use Topics   Alcohol use: Yes    Alcohol/week: 2.0 standard drinks of alcohol    Types: 2 Shots of liquor per week   Drug use: Not Currently    Types: Marijuana    Comment: 1x/mo - last time 06/09/15      Chart Review Today: I personally reviewed active problem list, medication list, allergies, family history, social history, health maintenance, notes from last encounter, lab results, imaging with the patient/caregiver today.   Review of Systems  Constitutional: Negative.  Negative for fever.  HENT: Negative.    Eyes: Negative.   Respiratory: Negative.    Cardiovascular: Negative.   Gastrointestinal: Negative.   Endocrine: Negative.   Genitourinary: Negative.   Musculoskeletal: Negative.   Skin: Negative.   Allergic/Immunologic: Negative.   Neurological: Negative.  Hematological: Negative.   Psychiatric/Behavioral: Negative.    All other systems reviewed and are negative.      Objective:   Vitals:   08/15/22 1048 08/15/22 1130  BP: (!) 130/90 122/86  Pulse: 91   Resp: 18   Temp: 98 F (36.7 C)   TempSrc: Oral   SpO2: 96%   Weight: 266 lb 14.4 oz (121.1 kg)   Height: 5\' 6"  (1.676 m)     Body mass index is 43.08 kg/m.  Physical Exam Vitals and nursing note reviewed.  Constitutional:      General: She is not in acute distress.    Appearance: Normal appearance. She is well-developed. She is obese. She is not ill-appearing, toxic-appearing or diaphoretic.  HENT:     Head:  Normocephalic and atraumatic.     Right Ear: External ear normal.     Left Ear: External ear normal.     Nose: Mucosal edema, congestion and rhinorrhea present.     Mouth/Throat:     Mouth: Mucous membranes are moist.     Pharynx: Uvula midline. Pharyngeal swelling (mild) and posterior oropharyngeal erythema present. No oropharyngeal exudate or uvula swelling.     Tonsils: No tonsillar exudate or tonsillar abscesses.  Eyes:     General: Lids are normal.     Conjunctiva/sclera: Conjunctivae normal.     Pupils: Pupils are equal, round, and reactive to light.  Neck:     Trachea: Phonation normal. No tracheal deviation.  Cardiovascular:     Rate and Rhythm: Normal rate and regular rhythm.     Pulses: Normal pulses.          Radial pulses are 2+ on the right side and 2+ on the left side.       Posterior tibial pulses are 2+ on the right side and 2+ on the left side.     Heart sounds: Normal heart sounds. No murmur heard.    No friction rub. No gallop.  Pulmonary:     Effort: Pulmonary effort is normal. No respiratory distress.     Breath sounds: Normal breath sounds. No stridor. No wheezing, rhonchi or rales.  Chest:     Chest wall: No tenderness.  Abdominal:     General: Bowel sounds are normal. There is no distension.     Palpations: Abdomen is soft.     Tenderness: There is no abdominal tenderness. There is no guarding or rebound.  Musculoskeletal:        General: No deformity. Normal range of motion.     Cervical back: Normal range of motion and neck supple.  Lymphadenopathy:     Cervical: Cervical adenopathy present.  Skin:    General: Skin is warm and dry.     Capillary Refill: Capillary refill takes less than 2 seconds.     Coloration: Skin is not pale.     Findings: No rash.  Neurological:     Mental Status: She is alert and oriented to person, place, and time.     Motor: No abnormal muscle tone.     Gait: Gait normal.  Psychiatric:        Speech: Speech normal.         Behavior: Behavior normal.      Results for orders placed or performed in visit on 03/15/22  COMPLETE METABOLIC PANEL WITH GFR  Result Value Ref Range   Glucose, Bld 93 65 - 99 mg/dL   BUN 19 7 - 25 mg/dL   Creat 1.61  0.50 - 1.05 mg/dL   eGFR 84 > OR = 60 ZO/XWR/6.04V4   BUN/Creatinine Ratio SEE NOTE: 6 - 22 (calc)   Sodium 139 135 - 146 mmol/L   Potassium 4.0 3.5 - 5.3 mmol/L   Chloride 105 98 - 110 mmol/L   CO2 25 20 - 32 mmol/L   Calcium 9.6 8.6 - 10.4 mg/dL   Total Protein 6.8 6.1 - 8.1 g/dL   Albumin 4.2 3.6 - 5.1 g/dL   Globulin 2.6 1.9 - 3.7 g/dL (calc)   AG Ratio 1.6 1.0 - 2.5 (calc)   Total Bilirubin 0.3 0.2 - 1.2 mg/dL   Alkaline phosphatase (APISO) 80 37 - 153 U/L   AST 22 10 - 35 U/L   ALT 15 6 - 29 U/L  Lipid panel  Result Value Ref Range   Cholesterol 192 <200 mg/dL   HDL 58 > OR = 50 mg/dL   Triglycerides 92 <098 mg/dL   LDL Cholesterol (Calc) 114 (H) mg/dL (calc)   Total CHOL/HDL Ratio 3.3 <5.0 (calc)   Non-HDL Cholesterol (Calc) 134 (H) <130 mg/dL (calc)       Assessment & Plan:    1. Essential hypertension, benign Uncontrolled after heavy drinking over the weekend. She had some LE edema that improved with taking 1.5 and 2x her pills at home for a day or two In office BP was minimally elevated and then when rechecked was at goal Discussed effects of ETOH and I would expect her BP to return to baseline, in the future she may need to drink less ETOH Plan to either resume her normal dose 20-12.5 daily and monitor and f/up in 1-2 weeks Or if she feels like BP has stayed high she is going to do 1.5 pills and monitor and f/up in 1-2 weeks  With the last BP recheck being normal I do feel like she can resume her normal dose - esp since in the past she has had a lot of SE to her HTN meds  BP Readings from Last 3 Encounters:  08/15/22 122/86  04/03/22 126/80  03/20/22 124/76     2. Upper respiratory tract infection, unspecified type Nasal mucosa very  inflamed with postnasal drip and discharge - can do 2nd gen anithistamine and intranasal steroid sprays - loratadine (CLARITIN) 10 MG tablet; Take 1 tablet (10 mg total) by mouth daily.  Dispense: 30 tablet; Refill: 11  3. Pharyngitis, unspecified etiology Rapid strep added on at end of visit due to cervical lymphadenopathy - POCT rapid strep A  4. Strep pharyngitis Rapid strep came back positive - it was run after pt left office (doorknob complaint at end of OV) so she will be notified of + results, abx sent in, advised to avoid sharing drinks/utensils/kissing for 24+ hours after starting antibiotics and replace toothbrush  after 24-48 h - amoxicillin (AMOXIL) 500 MG tablet; Take 1 tablet (500 mg total) by mouth 2 (two) times daily for 10 days.  Dispense: 20 tablet; Refill: 0  f/up in 1-2 weeks for BP recheck      Danelle Berry, PA-C 08/15/22 11:06 AM

## 2022-08-22 ENCOUNTER — Encounter: Payer: Self-pay | Admitting: Family Medicine

## 2022-09-29 ENCOUNTER — Other Ambulatory Visit: Payer: Self-pay | Admitting: Family Medicine

## 2022-09-29 DIAGNOSIS — I10 Essential (primary) hypertension: Secondary | ICD-10-CM

## 2022-10-21 NOTE — Patient Instructions (Incomplete)
You are due for shingles shot - you can do this at a pharmacy or you may check with your insurance and see if they cover it in our office and we are happy to administer it to you here. Mammogram is due in March - I will order it now you should be able to arrange that later with [facility] [address], [place], [place] [address] Scheduling phone #: [phone]  Health Maintenance  Topic Date Due   Zoster (Shingles) Vaccine (1 of 2) Never done   COVID-19 Vaccine (3 - 2023-24 season) 12/07/2021   Flu Shot  11/07/2022   Pap Smear  06/01/2023   Mammogram  06/28/2023   DTaP/Tdap/Td vaccine (2 - Td or Tdap) 04/27/2025   DEXA scan (bone density measurement)  01/02/2026   Colon Cancer Screening  10/30/2026   Hepatitis C Screening  Completed   HIV Screening  Completed   HPV Vaccine  Aged Out    See this link for instructions and documents that will help you complete advance care planning/advanced directives - including designating a medical power of attorney, completing a living will, etc [email or web address]   Preventive Care 45-33 Years Old, Female Preventive care refers to lifestyle choices and visits with your health care provider that can promote health and wellness. Preventive care visits are also called wellness exams. What can I expect for my preventive care visit? Counseling Your health care provider may ask you questions about your: Medical history, including: Past medical problems. Family medical history. Pregnancy history. Current health, including: Menstrual cycle. Method of birth control. Emotional well-being. Home life and relationship well-being. Sexual activity and sexual health. Lifestyle, including: Alcohol, nicotine or tobacco, and drug use. Access to firearms. Diet, exercise, and sleep habits. Work and work Astronomer. Sunscreen use. Safety issues such as  seatbelt and bike helmet use. Physical exam Your health care provider will check your: Height and weight. These may be used to calculate your BMI (body mass index). BMI is a measurement that tells if you are at a healthy weight. Waist circumference. This measures the distance around your waistline. This measurement also tells if you are at a healthy weight and may help predict your risk of certain diseases, such as type 2 diabetes and high blood pressure. Heart rate and blood pressure. Body temperature. Skin for abnormal spots. What immunizations do I need?  Vaccines are usually given at various ages, according to a schedule. Your health care provider will recommend vaccines for you based on your age, medical history, and lifestyle or other factors, such as travel or where you work. What tests do I need? Screening Your health care provider may recommend screening tests for certain conditions. This may include: Lipid and cholesterol levels. Diabetes screening. This is done by checking your blood sugar (glucose) after you have not eaten for a while (fasting). Pelvic exam and Pap test. Hepatitis B test. Hepatitis C test. HIV (human immunodeficiency virus) test. STI (sexually transmitted infection) testing, if you are at risk. Lung cancer screening. Colorectal cancer screening. Mammogram. Talk with your health care provider about when you should start having regular mammograms. This may depend on whether you have a family history of breast cancer. BRCA-related cancer screening. This may be done if you have a family history of breast, ovarian, tubal, or peritoneal cancers. Bone density scan. This is done to screen for osteoporosis. Talk with your health care provider about your test results, treatment options, and if necessary,  the need for more tests. Follow these instructions at home: Eating and drinking  Eat a diet that includes fresh fruits and vegetables, whole grains, lean protein, and  low-fat dairy products. Take vitamin and mineral supplements as recommended by your health care provider. Do not drink alcohol if: Your health care provider tells you not to drink. You are pregnant, may be pregnant, or are planning to become pregnant. If you drink alcohol: Limit how much you have to 0-1 drink a day. Know how much alcohol is in your drink. In the [place] one drink equals one 12 oz bottle of beer (355 mL), one 5 oz glass of wine (148 mL), or one 1 oz glass of hard liquor (44 mL). Lifestyle Brush your teeth every morning and night with fluoride toothpaste. Floss one time each day. Exercise for at least 30 minutes 5 or more days each week. Do not use any products that contain nicotine or tobacco. These products include cigarettes, chewing tobacco, and vaping devices, such as e-cigarettes. If you need help quitting, ask your health care provider. Do not use drugs. If you are sexually active, practice safe sex. Use a condom or other form of protection to prevent STIs. If you do not wish to become pregnant, use a form of birth control. If you plan to become pregnant, see your health care provider for a prepregnancy visit. Take aspirin only as told by your health care provider. Make sure that you understand how much to take and what form to take. Work with your health care provider to find out whether it is safe and beneficial for you to take aspirin daily. Find healthy ways to manage stress, such as: Meditation, yoga, or listening to music. Journaling. Talking to a trusted person. Spending time with friends and family. Minimize exposure to UV radiation to reduce your risk of skin cancer. Safety Always wear your seat belt while driving or riding in a vehicle. Do not drive: If you have been drinking alcohol. Do not ride with someone who has been drinking. When you are tired or distracted. While texting. If you have been using any mind-altering substances or drugs. Wear a helmet  and other protective equipment during sports activities. If you have firearms in your house, make sure you follow all gun safety procedures. Seek help if you have been physically or sexually abused. What's next? Visit your health care provider once a year for an annual wellness visit. Ask your health care provider how often you should have your eyes and teeth checked. Stay up to date on all vaccines. This information is not intended to replace advice given to you by your health care provider. Make sure you discuss any questions you have with your health care provider. Document Revised: 09/20/2020 Document Reviewed: 09/20/2020 Elsevier Patient Education  2024 [employer].

## 2022-10-22 ENCOUNTER — Encounter: Payer: Self-pay | Admitting: Family Medicine

## 2022-10-22 ENCOUNTER — Other Ambulatory Visit: Payer: Self-pay | Admitting: Family Medicine

## 2022-10-22 ENCOUNTER — Ambulatory Visit (INDEPENDENT_AMBULATORY_CARE_PROVIDER_SITE_OTHER): Payer: 59 | Admitting: Family Medicine

## 2022-10-22 VITALS — BP 102/74 | HR 100 | Temp 97.9°F | Resp 16 | Ht 65.25 in | Wt 268.4 lb

## 2022-10-22 DIAGNOSIS — Z803 Family history of malignant neoplasm of breast: Secondary | ICD-10-CM | POA: Diagnosis not present

## 2022-10-22 DIAGNOSIS — Z1231 Encounter for screening mammogram for malignant neoplasm of breast: Secondary | ICD-10-CM

## 2022-10-22 DIAGNOSIS — Z0000 Encounter for general adult medical examination without abnormal findings: Secondary | ICD-10-CM

## 2022-10-22 DIAGNOSIS — M545 Low back pain, unspecified: Secondary | ICD-10-CM

## 2022-10-22 DIAGNOSIS — Z800 Family history of malignant neoplasm of digestive organs: Secondary | ICD-10-CM

## 2022-10-22 DIAGNOSIS — E785 Hyperlipidemia, unspecified: Secondary | ICD-10-CM | POA: Diagnosis not present

## 2022-10-22 DIAGNOSIS — R7303 Prediabetes: Secondary | ICD-10-CM

## 2022-10-22 DIAGNOSIS — I10 Essential (primary) hypertension: Secondary | ICD-10-CM

## 2022-10-22 DIAGNOSIS — Z23 Encounter for immunization: Secondary | ICD-10-CM

## 2022-10-22 DIAGNOSIS — I8392 Asymptomatic varicose veins of left lower extremity: Secondary | ICD-10-CM

## 2022-10-22 LAB — CBC WITH DIFFERENTIAL/PLATELET
Eosinophils Absolute: 192 cells/uL (ref 15–500)
RDW: 12.8 % (ref 11.0–15.0)
Total Lymphocyte: 40.2 %

## 2022-10-22 NOTE — Progress Notes (Signed)
Patient: Amber Murray, Female    DOB: Nov 07, 1960, 62 y.o.   MRN: [id number] Danelle Berry, PA-C Visit Date: 10/22/2022  Today's Provider: Danelle Berry, PA-C   Chief Complaint  Patient presents with   Annual Exam   Subjective:   Annual physical exam:  Amber Murray is a 62 y.o. female who presents today for complete physical exam:  Exercise/Activity:  5 d a week about 20 min - she retired so walking less overall than when working for [employer] Diet/nutrition:  l;ast year she tried vegetarian diet, currently trying to eat healthy and still avoiding red meat in general Sleep: no concerns  SDOH Screenings   Food Insecurity: No Food Insecurity (10/22/2022)  Housing: Low Risk  (10/22/2022)  Transportation Needs: No Transportation Needs (10/22/2022)  Utilities: Not At Risk (10/22/2022)  Alcohol Screen: Low Risk  (10/22/2022)  Depression (PHQ2-9): Low Risk  (10/22/2022)  Financial Resource Strain: Low Risk  (10/22/2022)  Physical Activity: Insufficiently Active (10/22/2022)  Social Connections: Moderately Integrated (10/22/2022)  Stress: No Stress Concern Present (10/22/2022)  Tobacco Use: Medium Risk (10/22/2022)  Health Literacy: Adequate Health Literacy (10/22/2022)      USPSTF grade A and B recommendations - reviewed and addressed today  Depression:  Phq 9 completed today by patient, was reviewed by me with patient in the room PHQ score is neg, pt feels good    10/22/2022    2:07 PM 08/15/2022   10:50 AM 04/03/2022    1:56 PM 03/20/2022   11:42 AM  PHQ 2/9 Scores  PHQ - 2 Score 0 0 0 0  PHQ- 9 Score 0 0 0       10/22/2022    2:07 PM 08/15/2022   10:50 AM 04/03/2022    1:56 PM 03/20/2022   11:42 AM 03/15/2022   11:08 AM  Depression screen PHQ 2/9  Decreased Interest 0 0 0 0 0  Down, Depressed, Hopeless 0 0 0 0 0  PHQ - 2 Score 0 0 0 0 0  Altered sleeping 0 0 0  0  Tired, decreased energy 0 0 0  0  Change in appetite 0 0 0  0  Feeling bad or failure about  yourself  0 0 0  0  Trouble concentrating 0 0 0  0  Moving slowly or fidgety/restless 0 0 0  0  Suicidal thoughts 0 0 0  0  PHQ-9 Score 0 0 0  0  Difficult doing work/chores Not difficult at all Not difficult at all   Not difficult at all    Alcohol screening: Flowsheet Row Office Visit [facility] 10/22/2022 in [facility]  AUDIT-C Score 1       Immunizations and Health Maintenance: Health Maintenance  Topic Date Due   COVID-19 Vaccine (3 - 2023-24 season) 11/07/2022 (Originally 12/07/2021)   Zoster Vaccines- Shingrix (1 of 2) 01/22/2023 (Originally 08/21/2010)   INFLUENZA VACCINE  11/07/2022   PAP SMEAR-Modifier  06/01/2023   MAMMOGRAM  06/28/2023   DTaP/Tdap/Td (2 - Td or Tdap) 04/27/2025   DEXA SCAN  01/02/2026   Colonoscopy  10/30/2026   Hepatitis C Screening  Completed   HIV Screening  Completed   HPV VACCINES  Aged Out     Hep C Screening: done  STD testing and prevention (HIV/chl/gon/syphilis):  see above, no additional testing desired by pt today  Intimate partner violence:denies  Sexual History/Pain during Intercourse: Married, denies pain/bothersome sx with sex  Menstrual History/LMP/Abnormal Bleeding: denies AUB, menopause  13 years ago  No LMP recorded. Patient is postmenopausal.  Incontinence Symptoms: Previously reports mild stress incontinence,  improved some with cutting out soda/coffee  Breast cancer: UTD, does annually, did in March, reviewed last mammo, repeat annually  Last Mammogram: *see HM list above BRCA gene screening: none known  Cervical cancer screening: utd, due in 2025-2027 with cotesting repeat in 3-5 years  Osteoporosis:   Discussion on osteoporosis per age, including high calcium and vitamin D supplementation, weight bearing exercises Dexa done in 2022 which was normal Discussed future screening at age 66 or 5 years  Recommended vit d and calcium supplements and weight bearing exercise  On vit d not  calcium  Skin cancer:  Hx of skin CA -  NO Discussed atypical lesions   Colorectal cancer:   Colonoscopy is UTD   Discussed concerning signs and sx of CRC, pt denies change in bowels  Lung cancer:   Low Dose CT Chest recommended if Age 51-80 years, 20 pack-year currently smoking OR have quit w/in 15years. Patient does not qualify.    Social History   Tobacco Use   Smoking status: Former    Current packs/day: 0.00    Types: Cigarettes    Quit date: 04/27/2005    Years since quitting: 17.4   Smokeless tobacco: Never  Vaping Use   Vaping status: Never Used  Substance Use Topics   Alcohol use: Yes    Alcohol/week: 2.0 standard drinks of alcohol    Types: 2 Shots of liquor per week   Drug use: Not Currently    Types: Marijuana    Comment: 1x/mo - last time 06/09/15     Flowsheet Row Office Visit from 10/22/2022 in [facility]  AUDIT-C Score 1       Family History  Problem Relation Age of Onset   Kidney disease Mother    Hypertension Mother    Cancer Sister        breast   Breast cancer Sister 55   Cancer Brother        colon   Kidney disease Brother    Hypertension Father    Cerebral palsy Brother    Colon cancer Brother    HIV/AIDS Brother    Cancer Other    Breast cancer Other      Blood pressure/Hypertension: BP Readings from Last 3 Encounters:  10/22/22 102/74  08/15/22 122/86  04/03/22 126/80    Weight/Obesity: Wt Readings from Last 3 Encounters:  10/22/22 268 lb 6.4 oz (121.7 kg)  08/15/22 266 lb 14.4 oz (121.1 kg)  04/03/22 276 lb (125.2 kg)   BMI Readings from Last 3 Encounters:  10/22/22 44.32 kg/m  08/15/22 43.08 kg/m  04/03/22 44.55 kg/m     Lipids:  Lab Results  Component Value Date   CHOL 192 03/15/2022   CHOL 224 (H) 09/13/2021   CHOL 192 05/31/2020   Lab Results  Component Value Date   HDL 58 03/15/2022   HDL 60 09/13/2021   HDL 63 05/31/2020   Lab Results  Component Value Date   LDLCALC 114  (H) 03/15/2022   LDLCALC 145 (H) 09/13/2021   LDLCALC 113 (H) 05/31/2020   Lab Results  Component Value Date   TRIG 92 03/15/2022   TRIG 89 09/13/2021   TRIG 70 05/31/2020   Lab Results  Component Value Date   CHOLHDL 3.3 03/15/2022   CHOLHDL 3.7 09/13/2021   CHOLHDL 3.0 05/31/2020   No results found for: "LDLDIRECT"  Based on the results of lipid panel his/her cardiovascular risk factor ( using [facility] )  in the next 10 years is: The 10-year ASCVD risk score (Arnett DK, et al., 2019) is: 7.7%   Values used to calculate the score:     Age: 62 years     Sex: Female     Is Non-Hispanic African American: Yes     Diabetic: No     Tobacco smoker: Yes     Systolic Blood Pressure: 102 mmHg     Is BP treated: Yes     HDL Cholesterol: 58 mg/dL     Total Cholesterol: 192 mg/dL  Glucose:  Glucose, Bld  Date Value Ref Range Status  03/15/2022 93 65 - 99 mg/dL Final    Comment:    .            Fasting reference interval .   09/13/2021 98 65 - 99 mg/dL Final    Comment:    .            Fasting reference interval .   05/31/2020 89 65 - 99 mg/dL Final    Comment:    .            Fasting reference interval .     Advanced Care Planning:  A voluntary discussion about advance care planning including the explanation and discussion of advance directives.   Discussed health care proxy and Living will, and the patient was able to identify a health care proxy as spouse, Jonny Ruiz (husband).   Patient does not have a living will at present time.   Social History       Social History   Socioeconomic History   Marital status: Married    Spouse name: John   Number of children: 3   Years of education: 12   Highest education level: Some college, no degree  Occupational History   Not on file  Tobacco Use   Smoking status: Former    Current packs/day: 0.00    Types: Cigarettes    Quit date: 04/27/2005    Years since quitting: 17.4   Smokeless tobacco: Never  Vaping Use    Vaping status: Never Used  Substance and Sexual Activity   Alcohol use: Yes    Alcohol/week: 2.0 standard drinks of alcohol    Types: 2 Shots of liquor per week   Drug use: Not Currently    Types: Marijuana    Comment: 1x/mo - last time 06/09/15   Sexual activity: Yes    Birth control/protection: Post-menopausal  Other Topics Concern   Not on file  Social History Narrative   Not on file   Social Determinants of Health   Financial Resource Strain: Low Risk  (10/22/2022)   Overall Financial Resource Strain (CARDIA)    Difficulty of Paying Living Expenses: Not hard at all  Food Insecurity: No Food Insecurity (10/22/2022)   Hunger Vital Sign    Worried About Running Out of Food in the Last Year: Never true    Ran Out of Food in the Last Year: Never true  Transportation Needs: No Transportation Needs (10/22/2022)   PRAPARE - Administrator, Civil Service (Medical): No    Lack of Transportation (Non-Medical): No  Physical Activity: Insufficiently Active (10/22/2022)   Exercise Vital Sign    Days of Exercise per Week: 5 days    Minutes of Exercise per Session: 20 min  Stress: No Stress Concern Present (10/22/2022)   [place]  Institute of Occupational Health - Occupational Stress Questionnaire    Feeling of Stress : Not at all  Social Connections: Moderately Integrated (10/22/2022)   Social Connection and Isolation Panel [NHANES]    Frequency of Communication with Friends and Family: More than three times a week    Frequency of Social Gatherings with Friends and Family: More than three times a week    Attends Religious Services: More than 4 times per year    Active Member of [employer] or Organizations: No    Attends Banker Meetings: Never    Marital Status: Married    Family History        Family History  Problem Relation Age of Onset   Kidney disease Mother    Hypertension Mother    Cancer Sister        breast   Breast cancer Sister 66   Cancer Brother         colon   Kidney disease Brother    Hypertension Father    Cerebral palsy Brother    Colon cancer Brother    HIV/AIDS Brother    Cancer Other    Breast cancer Other     Patient Active Problem List   Diagnosis Date Noted   Hyperlipidemia 09/21/2021   Essential hypertension, benign 11/14/2015   Prediabetes 11/13/2015   Benign neoplasm of rectosigmoid junction    Family history of breast cancer 04/28/2015   Family history of colon cancer 04/28/2015   Obesity, Class III, BMI 40-49.9 (morbid obesity) (HCC) 04/28/2015   Need for diphtheria-tetanus-pertussis (Tdap) vaccine, adult/adolescent 04/28/2015    Past Surgical History:  Procedure Laterality Date   CHOLECYSTECTOMY     COLONOSCOPY N/A 10/29/2021   Procedure: COLONOSCOPY;  Surgeon: Toney Reil, MD;  Location: [facility] ENDOSCOPY;  Service: Gastroenterology;  Laterality: N/A;   COLONOSCOPY WITH PROPOFOL N/A 06/16/2015   Procedure: COLONOSCOPY WITH PROPOFOL;  Surgeon: Midge Minium, MD;  Location: [facility];  Service: Endoscopy;  Laterality: N/A;   POLYPECTOMY  06/16/2015   Procedure: POLYPECTOMY INTESTINAL;  Surgeon: Midge Minium, MD;  Location: [facility];  Service: Endoscopy;;  Sigmoid colon polyp   TUBAL LIGATION       Current Outpatient Medications:    aspirin EC 81 MG tablet, Take 81 mg by mouth daily., Disp: , Rfl:    Cholecalciferol (VITAMIN D3 PO), Take by mouth., Disp: , Rfl:    Cyanocobalamin (VITAMIN B-12 PO), Take 1,000 mcg by mouth 2 (two) times daily., Disp: , Rfl:    lisinopril-hydrochlorothiazide (ZESTORETIC) 20-12.5 MG tablet, Take 1 tablet by mouth once daily, Disp: 90 tablet, Rfl: 0   loratadine (CLARITIN) 10 MG tablet, Take 1 tablet (10 mg total) by mouth daily., Disp: 30 tablet, Rfl: 11   meloxicam (MOBIC) 15 MG tablet, Take 1 tablet (15 mg total) by mouth as needed., Disp: 90 tablet, Rfl: 2   omeprazole (PRILOSEC) 40 MG capsule, Take 1 capsule (40 mg total) by mouth 2 (two) times daily.  For GI protection while taking NSAIDs, Disp: 60 capsule, Rfl: 3   famotidine (PEPCID) 20 MG tablet, Take 1 tablet (20 mg total) by mouth 2 (two) times daily. (Patient not taking: Reported on 10/22/2022), Disp: 30 tablet, Rfl: 0   simvastatin (ZOCOR) 20 MG tablet, Take 1 tablet (20 mg total) by mouth at bedtime. (Patient not taking: Reported on 10/22/2022), Disp: 60 tablet, Rfl: 0  No Known Allergies  Patient Care Team: Danelle Berry, PA-C as PCP - General (Family Medicine)  Chart Review: I personally reviewed active problem list, medication list, allergies, family history, social history, health maintenance, notes from last encounter, lab results, imaging with the patient/caregiver today.   Review of Systems  Constitutional: Negative.   HENT: Negative.    Eyes: Negative.   Respiratory: Negative.    Cardiovascular: Negative.   Gastrointestinal: Negative.   Endocrine: Negative.   Genitourinary: Negative.   Musculoskeletal: Negative.   Skin: Negative.   Allergic/Immunologic: Negative.   Neurological: Negative.   Hematological: Negative.   Psychiatric/Behavioral: Negative.    All other systems reviewed and are negative.         Objective:   Vitals:  Vitals:   10/22/22 1416  BP: 102/74  Pulse: 100  Resp: 16  Temp: 97.9 F (36.6 C)  TempSrc: Oral  SpO2: 97%  Weight: 268 lb 6.4 oz (121.7 kg)  Height: 5' 5.25" (1.657 m)    Body mass index is 44.32 kg/m.  Physical Exam Vitals and nursing note reviewed.  Constitutional:      General: She is not in acute distress.    Appearance: Normal appearance. She is well-developed and well-groomed. She is obese. She is not ill-appearing, toxic-appearing or diaphoretic.  HENT:     Head: Normocephalic and atraumatic.     Right Ear: External ear normal.     Left Ear: External ear normal.     Nose: Nose normal.     Mouth/Throat:     Mouth: Mucous membranes are moist.     Pharynx: Oropharynx is clear. Uvula midline. No oropharyngeal  exudate or posterior oropharyngeal erythema.  Eyes:     General: Lids are normal. No scleral icterus.       Right eye: No discharge.        Left eye: No discharge.     Conjunctiva/sclera: Conjunctivae normal.  Neck:     Trachea: Phonation normal. No tracheal deviation.  Cardiovascular:     Rate and Rhythm: Normal rate and regular rhythm.     Pulses: Normal pulses.          Radial pulses are 2+ on the right side and 2+ on the left side.       Posterior tibial pulses are 2+ on the right side and 2+ on the left side.     Heart sounds: Normal heart sounds. No murmur heard.    No friction rub. No gallop.  Pulmonary:     Effort: Pulmonary effort is normal. No respiratory distress.     Breath sounds: Normal breath sounds. No stridor. No wheezing, rhonchi or rales.  Chest:     Chest wall: No tenderness.  Abdominal:     General: Bowel sounds are normal. There is no distension.     Palpations: Abdomen is soft.     Tenderness: There is no abdominal tenderness. There is no guarding or rebound.  Musculoskeletal:     Cervical back: Normal range of motion and neck supple.     Right lower leg: No edema.     Left lower leg: No edema.  Lymphadenopathy:     Cervical: No cervical adenopathy.  Skin:    General: Skin is warm and dry.     Capillary Refill: Capillary refill takes less than 2 seconds.     Coloration: Skin is not jaundiced or pale.     Findings: No rash.  Neurological:     Mental Status: She is alert. Mental status is at baseline.     Motor: No abnormal muscle tone.  Gait: Gait normal.  Psychiatric:        Mood and Affect: Mood normal.        Speech: Speech normal.        Behavior: Behavior normal. Behavior is cooperative.       Fall Risk:    10/22/2022    2:06 PM 08/15/2022   10:49 AM 04/03/2022    1:56 PM 03/20/2022   11:42 AM 03/15/2022   11:07 AM  Fall Risk   Falls in the past year? 0 0 0 0 0  Number falls in past yr: 0 0 0 0 0  Injury with Fall? 0 0 0 0 0  Risk  for fall due to : No Fall Risks No Fall Risks No Fall Risks  No Fall Risks  Follow up Falls prevention discussed;Education provided;Falls evaluation completed Falls prevention discussed;Education provided;Falls evaluation completed Falls prevention discussed Falls evaluation completed Falls prevention discussed;Education provided;Falls evaluation completed    Functional Status Survey: Is the patient deaf or have difficulty hearing?: No Does the patient have difficulty seeing, even when wearing glasses/contacts?: No Does the patient have difficulty concentrating, remembering, or making decisions?: No Does the patient have difficulty walking or climbing stairs?: No Does the patient have difficulty dressing or bathing?: No Does the patient have difficulty doing errands alone such as visiting a doctor's office or shopping?: No   Assessment & Plan:    CPE completed today  USPSTF grade A and B recommendations reviewed with patient; age-appropriate recommendations, preventive care, screening tests, etc discussed and encouraged; healthy living encouraged; see AVS for patient education given to patient  Discussed importance of 150 minutes of physical activity weekly, AHA exercise recommendations given to pt in AVS/handout  Discussed importance of healthy diet:  eating lean meats and proteins, avoiding trans fats and saturated fats, avoid simple sugars and excessive carbs in diet, eat 6 servings of fruit/vegetables daily and drink plenty of water and avoid sweet beverages.    Recommended pt to do annual eye exam and routine dental exams/cleanings  Depression, alcohol, fall screening completed as documented above and per flowsheets  Advance Care planning information and packet discussed and offered today, encouraged pt to discuss with family members/spouse/partner/friends and complete Advanced directive packet and bring copy to office   Reviewed Health Maintenance: Health Maintenance  Topic Date  Due   COVID-19 Vaccine (3 - 2023-24 season) 11/07/2022 (Originally 12/07/2021)   Zoster Vaccines- Shingrix (1 of 2) 01/22/2023 (Originally 08/21/2010)   INFLUENZA VACCINE  11/07/2022   PAP SMEAR-Modifier  06/01/2023   MAMMOGRAM  06/28/2023   DTaP/Tdap/Td (2 - Td or Tdap) 04/27/2025   DEXA SCAN  01/02/2026   Colonoscopy  10/30/2026   Hepatitis C Screening  Completed   HIV Screening  Completed   HPV VACCINES  Aged Out    Immunizations: Immunization History  Administered Date(s) Administered   PFIZER(Purple Top)SARS-COV-2 Vaccination 07/09/2019, 08/03/2019   Tdap 04/28/2015   Vaccines:   Shingrix: due - 75-64 yo and ask insurance if covered when patient above 46 yo Pneumonia: not indicated currently- educated and discussed with patient. Flu: due annually but currently out of season educated and discussed with patient.    ICD-10-CM   1. Annual physical exam  Z00.00 COMPLETE METABOLIC PANEL WITH GFR    CBC with Differential/Platelet    Lipid panel    Hemoglobin A1c    TSH    VITAMIN D 25 Hydroxy (Vit-D Deficiency, Fractures)    MM 3D SCREENING MAMMOGRAM BILATERAL BREAST  2. Hyperlipidemia, unspecified hyperlipidemia type  E78.5 COMPLETE METABOLIC PANEL WITH GFR    Lipid panel    3. Family history of breast cancer  Z80.3     4. Family history of colon cancer  Z80.0     5. Obesity, Class III, BMI 40-49.9 (morbid obesity) (HCC)  E66.01 COMPLETE METABOLIC PANEL WITH GFR    CBC with Differential/Platelet    Lipid panel    Hemoglobin A1c    TSH    VITAMIN D 25 Hydroxy (Vit-D Deficiency, Fractures)    6. Prediabetes  R73.03 COMPLETE METABOLIC PANEL WITH GFR    Hemoglobin A1c    7. Essential hypertension, benign  I10 COMPLETE METABOLIC PANEL WITH GFR    8. Encounter for screening mammogram for malignant neoplasm of breast  Z12.31 MM 3D SCREENING MAMMOGRAM BILATERAL BREAST    9. Need for shingles vaccine  Z23    due for second shot, 1st was done at [facility] [address] [place]  [place]    10. 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 Low back pain, unspecified back pain laterality, unspecified chronicity, unspecified whether sciatica present  M54.50    recurrent, gets worse with walking, pt working on core strength/abs, declines PT consult at this time      Return in about 6 months (around 04/24/2023) for Routine follow-up.     Danelle Berry, PA-C 10/22/22 2:24 PM  [facility]

## 2022-10-23 LAB — COMPLETE METABOLIC PANEL WITH GFR
AG Ratio: 1.4 (calc) (ref 1.0–2.5)
ALT: 14 U/L (ref 6–29)
AST: 15 U/L (ref 10–35)
Albumin: 4.2 g/dL (ref 3.6–5.1)
Alkaline phosphatase (APISO): 108 U/L (ref 37–153)
BUN/Creatinine Ratio: 18 (calc) (ref 6–22)
BUN: 24 mg/dL (ref 7–25)
CO2: 23 mmol/L (ref 20–32)
Calcium: 9.5 mg/dL (ref 8.6–10.4)
Chloride: 107 mmol/L (ref 98–110)
Creat: 1.33 mg/dL — ABNORMAL HIGH (ref 0.50–1.05)
Globulin: 3.1 g/dL (calc) (ref 1.9–3.7)
Glucose, Bld: 102 mg/dL — ABNORMAL HIGH (ref 65–99)
Potassium: 3.4 mmol/L — ABNORMAL LOW (ref 3.5–5.3)
Sodium: 140 mmol/L (ref 135–146)
Total Bilirubin: 0.4 mg/dL (ref 0.2–1.2)
Total Protein: 7.3 g/dL (ref 6.1–8.1)
eGFR: 45 mL/min/{1.73_m2} — ABNORMAL LOW (ref >=60)

## 2022-10-23 LAB — CBC WITH DIFFERENTIAL/PLATELET
Absolute Monocytes: 400 cells/uL (ref 200–950)
Basophils Absolute: 62 cells/uL (ref 0–200)
Basophils Relative: 1.2 %
Eosinophils Relative: 3.7 %
HCT: 32.8 % — ABNORMAL LOW (ref 35.0–45.0)
Hemoglobin: 11.1 g/dL — ABNORMAL LOW (ref 11.7–15.5)
Lymphs Abs: 2090 cells/uL (ref 850–3900)
MCH: 29.6 pg (ref 27.0–33.0)
MCHC: 33.8 g/dL (ref 32.0–36.0)
MCV: 87.5 fL (ref 80.0–100.0)
MPV: 9.2 fL (ref 7.5–12.5)
Monocytes Relative: 7.7 %
Neutro Abs: 2454 cells/uL (ref 1500–7800)
Neutrophils Relative %: 47.2 %
Platelets: 322 10*3/uL (ref 140–400)
RBC: 3.75 10*6/uL — ABNORMAL LOW (ref 3.80–5.10)
WBC: 5.2 10*3/uL (ref 3.8–10.8)

## 2022-10-23 LAB — VITAMIN D 25 HYDROXY (VIT D DEFICIENCY, FRACTURES): Vit D, 25-Hydroxy: 59 ng/mL (ref 30–100)

## 2022-10-23 LAB — TSH: TSH: 1.21 mIU/L (ref 0.40–4.50)

## 2022-10-23 LAB — LIPID PANEL
Cholesterol: 173 mg/dL (ref <200)
HDL: 48 mg/dL — ABNORMAL LOW (ref >=50)
LDL Cholesterol (Calc): 102 mg/dL (calc) — ABNORMAL HIGH
Non-HDL Cholesterol (Calc): 125 mg/dL (calc) (ref <130)
Total CHOL/HDL Ratio: 3.6 (calc) (ref <5.0)
Triglycerides: 134 mg/dL (ref <150)

## 2022-10-23 LAB — HEMOGLOBIN A1C
Hgb A1c MFr Bld: 5.9 % of total Hgb — ABNORMAL HIGH (ref <5.7)
Mean Plasma Glucose: 123 mg/dL
eAG (mmol/L): 6.8 mmol/L

## 2022-10-24 ENCOUNTER — Other Ambulatory Visit: Payer: Self-pay | Admitting: Family Medicine

## 2022-10-24 DIAGNOSIS — D649 Anemia, unspecified: Secondary | ICD-10-CM

## 2022-10-24 DIAGNOSIS — N179 Acute kidney failure, unspecified: Secondary | ICD-10-CM

## 2022-10-24 DIAGNOSIS — I10 Essential (primary) hypertension: Secondary | ICD-10-CM

## 2022-10-24 MED ORDER — LISINOPRIL-HYDROCHLOROTHIAZIDE 20-12.5 MG PO TABS
0.5000 | ORAL_TABLET | Freq: Every day | ORAL | Status: DC
Start: 2022-10-24 — End: 2022-12-23

## 2022-11-19 ENCOUNTER — Ambulatory Visit: Payer: 59

## 2022-11-19 VITALS — BP 116/78 | HR 78 | Temp 97.9°F | Resp 16 | Ht 65.25 in | Wt 277.3 lb

## 2022-11-19 DIAGNOSIS — N179 Acute kidney failure, unspecified: Secondary | ICD-10-CM

## 2022-11-19 DIAGNOSIS — D649 Anemia, unspecified: Secondary | ICD-10-CM

## 2022-11-21 ENCOUNTER — Ambulatory Visit: Payer: 59 | Admitting: Family Medicine

## 2022-11-22 ENCOUNTER — Ambulatory Visit: Payer: 59 | Admitting: Family Medicine

## 2022-11-22 ENCOUNTER — Ambulatory Visit (INDEPENDENT_AMBULATORY_CARE_PROVIDER_SITE_OTHER): Payer: 59

## 2022-11-22 DIAGNOSIS — N179 Acute kidney failure, unspecified: Secondary | ICD-10-CM

## 2022-11-22 DIAGNOSIS — I10 Essential (primary) hypertension: Secondary | ICD-10-CM

## 2022-11-22 DIAGNOSIS — D649 Anemia, unspecified: Secondary | ICD-10-CM

## 2022-11-22 LAB — POC HEMOCCULT BLD/STL (HOME/3-CARD/SCREEN)
Card #2 Fecal Occult Blod, POC: NEGATIVE
Card #3 Fecal Occult Blood, POC: NEGATIVE
Fecal Occult Blood, POC: NEGATIVE

## 2022-11-25 NOTE — Addendum Note (Signed)
Addended by: Danelle Berry on: 11/25/2022 08:43 AM   Modules accepted: Orders

## 2022-12-22 ENCOUNTER — Other Ambulatory Visit: Payer: Self-pay | Admitting: Family Medicine

## 2022-12-22 DIAGNOSIS — I10 Essential (primary) hypertension: Secondary | ICD-10-CM

## 2022-12-24 ENCOUNTER — Encounter: Payer: Self-pay | Admitting: Family Medicine

## 2022-12-24 LAB — VITAMIN B12: Vitamin B-12: 1826 pg/mL — ABNORMAL HIGH (ref 200–1100)

## 2022-12-24 LAB — CBC WITH DIFFERENTIAL/PLATELET
Absolute Monocytes: 536 {cells}/uL (ref 200–950)
Basophils Absolute: 40 {cells}/uL (ref 0–200)
Basophils Relative: 1 %
Eosinophils Absolute: 200 {cells}/uL (ref 15–500)
Eosinophils Relative: 5 %
HCT: 37.5 % (ref 35.0–45.0)
Hemoglobin: 12.3 g/dL (ref 11.7–15.5)
Lymphs Abs: 1740 {cells}/uL (ref 850–3900)
MCH: 29.9 pg (ref 27.0–33.0)
MCHC: 32.8 g/dL (ref 32.0–36.0)
MCV: 91.2 fL (ref 80.0–100.0)
MPV: 9.5 fL (ref 7.5–12.5)
Monocytes Relative: 13.4 %
Neutro Abs: 1484 {cells}/uL — ABNORMAL LOW (ref 1500–7800)
Neutrophils Relative %: 37.1 %
Platelets: 306 10*3/uL (ref 140–400)
RBC: 4.11 10*6/uL (ref 3.80–5.10)
RDW: 12.2 % (ref 11.0–15.0)
Total Lymphocyte: 43.5 %
WBC: 4 10*3/uL (ref 3.8–10.8)

## 2022-12-24 LAB — BASIC METABOLIC PANEL WITH GFR
BUN: 21 mg/dL (ref 7–25)
CO2: 22 mmol/L (ref 20–32)
Calcium: 9.7 mg/dL (ref 8.6–10.4)
Chloride: 106 mmol/L (ref 98–110)
Creat: 1.02 mg/dL (ref 0.50–1.05)
Glucose, Bld: 91 mg/dL (ref 65–99)
Potassium: 4.1 mmol/L (ref 3.5–5.3)
Sodium: 138 mmol/L (ref 135–146)
eGFR: 62 mL/min/{1.73_m2} (ref >=60)

## 2022-12-24 LAB — TSH: TSH: 1.73 m[IU]/L (ref 0.40–4.50)

## 2022-12-24 LAB — T4, FREE: Free T4: 1.2 ng/dL (ref 0.8–1.8)

## 2023-03-07 ENCOUNTER — Other Ambulatory Visit: Payer: Self-pay | Admitting: Family Medicine

## 2023-03-07 DIAGNOSIS — M545 Low back pain, unspecified: Secondary | ICD-10-CM

## 2023-03-07 DIAGNOSIS — M25551 Pain in right hip: Secondary | ICD-10-CM

## 2023-03-22 ENCOUNTER — Other Ambulatory Visit: Payer: Self-pay | Admitting: Family Medicine

## 2023-03-22 DIAGNOSIS — I10 Essential (primary) hypertension: Secondary | ICD-10-CM

## 2023-04-07 ENCOUNTER — Ambulatory Visit: Payer: 59 | Admitting: Family Medicine

## 2023-04-07 ENCOUNTER — Other Ambulatory Visit: Payer: Self-pay | Admitting: Family Medicine

## 2023-04-07 DIAGNOSIS — M545 Low back pain, unspecified: Secondary | ICD-10-CM

## 2023-04-07 DIAGNOSIS — M25551 Pain in right hip: Secondary | ICD-10-CM

## 2023-04-07 NOTE — Telephone Encounter (Signed)
Needs appt

## 2023-04-08 NOTE — Telephone Encounter (Signed)
Pt states she does not need this filled at this time and will call when she needs an appt

## 2023-06-18 DIAGNOSIS — M955 Acquired deformity of pelvis: Secondary | ICD-10-CM | POA: Diagnosis not present

## 2023-06-18 DIAGNOSIS — M9905 Segmental and somatic dysfunction of pelvic region: Secondary | ICD-10-CM | POA: Diagnosis not present

## 2023-06-18 DIAGNOSIS — M9903 Segmental and somatic dysfunction of lumbar region: Secondary | ICD-10-CM | POA: Diagnosis not present

## 2023-06-19 DIAGNOSIS — M9903 Segmental and somatic dysfunction of lumbar region: Secondary | ICD-10-CM | POA: Diagnosis not present

## 2023-06-19 DIAGNOSIS — M9905 Segmental and somatic dysfunction of pelvic region: Secondary | ICD-10-CM | POA: Diagnosis not present

## 2023-06-19 DIAGNOSIS — M955 Acquired deformity of pelvis: Secondary | ICD-10-CM | POA: Diagnosis not present

## 2023-06-20 ENCOUNTER — Ambulatory Visit: Admitting: Family Medicine

## 2023-06-24 DIAGNOSIS — M9903 Segmental and somatic dysfunction of lumbar region: Secondary | ICD-10-CM | POA: Diagnosis not present

## 2023-06-24 DIAGNOSIS — M955 Acquired deformity of pelvis: Secondary | ICD-10-CM | POA: Diagnosis not present

## 2023-06-24 DIAGNOSIS — M9905 Segmental and somatic dysfunction of pelvic region: Secondary | ICD-10-CM | POA: Diagnosis not present

## 2023-06-30 ENCOUNTER — Encounter

## 2023-06-30 ENCOUNTER — Ambulatory Visit: Admitting: Family Medicine

## 2023-07-01 DIAGNOSIS — M955 Acquired deformity of pelvis: Secondary | ICD-10-CM | POA: Diagnosis not present

## 2023-07-01 DIAGNOSIS — M9905 Segmental and somatic dysfunction of pelvic region: Secondary | ICD-10-CM | POA: Diagnosis not present

## 2023-07-01 DIAGNOSIS — M9903 Segmental and somatic dysfunction of lumbar region: Secondary | ICD-10-CM | POA: Diagnosis not present

## 2023-07-08 ENCOUNTER — Ambulatory Visit: Admitting: Family Medicine

## 2023-07-08 ENCOUNTER — Ambulatory Visit
Admission: RE | Admit: 2023-07-08 | Discharge: 2023-07-08 | Disposition: A | Discharge status: Home / Self Care | Payer: Self-pay | Source: Ambulatory Visit | Attending: Family Medicine | Admitting: Family Medicine

## 2023-07-08 DIAGNOSIS — Z1231 Encounter for screening mammogram for malignant neoplasm of breast: Secondary | ICD-10-CM | POA: Diagnosis not present

## 2023-07-08 DIAGNOSIS — Z0000 Encounter for general adult medical examination without abnormal findings: Secondary | ICD-10-CM

## 2023-07-09 ENCOUNTER — Ambulatory Visit: Admitting: Family Medicine

## 2023-07-09 ENCOUNTER — Encounter: Payer: Self-pay | Admitting: Family Medicine

## 2023-07-09 VITALS — BP 132/72 | HR 91 | Resp 16 | Ht 65.0 in | Wt 295.0 lb

## 2023-07-09 DIAGNOSIS — R7303 Prediabetes: Secondary | ICD-10-CM

## 2023-07-09 DIAGNOSIS — Z713 Dietary counseling and surveillance: Secondary | ICD-10-CM

## 2023-07-09 DIAGNOSIS — J310 Chronic rhinitis: Secondary | ICD-10-CM

## 2023-07-09 DIAGNOSIS — I10 Essential (primary) hypertension: Secondary | ICD-10-CM

## 2023-07-09 DIAGNOSIS — E538 Deficiency of other specified B group vitamins: Secondary | ICD-10-CM

## 2023-07-09 DIAGNOSIS — M9905 Segmental and somatic dysfunction of pelvic region: Secondary | ICD-10-CM | POA: Diagnosis not present

## 2023-07-09 DIAGNOSIS — M62838 Other muscle spasm: Secondary | ICD-10-CM

## 2023-07-09 DIAGNOSIS — Z7689 Persons encountering health services in other specified circumstances: Secondary | ICD-10-CM

## 2023-07-09 DIAGNOSIS — M9903 Segmental and somatic dysfunction of lumbar region: Secondary | ICD-10-CM | POA: Diagnosis not present

## 2023-07-09 DIAGNOSIS — D649 Anemia, unspecified: Secondary | ICD-10-CM

## 2023-07-09 DIAGNOSIS — M955 Acquired deformity of pelvis: Secondary | ICD-10-CM | POA: Diagnosis not present

## 2023-07-09 DIAGNOSIS — E785 Hyperlipidemia, unspecified: Secondary | ICD-10-CM

## 2023-07-09 DIAGNOSIS — J069 Acute upper respiratory infection, unspecified: Secondary | ICD-10-CM

## 2023-07-09 MED ORDER — LORATADINE 10 MG PO TABS
10.0000 mg | ORAL_TABLET | Freq: Every day | ORAL | 11 refills | Status: AC
Start: 2023-07-09 — End: ?

## 2023-07-09 NOTE — Progress Notes (Unsigned)
 Name: Amber Murray   MRN: [id number]    DOB: Jan 21, 1961   Date:07/09/2023       Progress Note  Chief Complaint  Patient presents with  . Medical Management of Chronic Issues  . Hypertension    Taking Lisinopril-hydrochlorothiazide in 4ths     Subjective:   Amber Murray is a 63 y.o. female, presents to clinic for routine follow up on chronic conditions  Aspirin - taking 81 mg (she says someone recommended it for her) Did risk assessment for ASA - recommended pt stop ASA  She is concerned about weight gain Eating more now that she is retired, home more and sedentary - was very active when working for [employer] from Last 5 Encounters:  07/09/23 295 lb (133.8 kg)  11/19/22 277 lb 4.8 oz (125.8 kg)  10/22/22 268 lb 6.4 oz (121.7 kg)  08/15/22 266 lb 14.4 oz (121.1 kg)  04/03/22 276 lb (125.2 kg)   BMI Readings from Last 5 Encounters:  07/09/23 49.09 kg/m  11/19/22 45.79 kg/m  10/22/22 44.32 kg/m  08/15/22 43.08 kg/m  04/03/22 44.55 kg/m   HTN on lisinipril-hydrochlorothiazide, but cut meds in 4ths and then taking every other day BP Readings from Last 3 Encounters:  07/09/23 132/72  11/19/22 116/78  10/22/22 102/74  Having SE - cough and muscle spasms/cramps   HLD not on meds Lab Results  Component Value Date   CHOL 173 10/22/2022   HDL 48 (L) 10/22/2022   LDLCALC 102 (H) 10/22/2022   TRIG 134 10/22/2022   CHOLHDL 3.6 10/22/2022    The 08-MVHQ ASCVD risk score (Arnett DK, et al., 2019) is: 6.2%   Values used to calculate the score:     Age: 30 years     Sex: Female     Is Non-Hispanic African American: Yes     Diabetic: No     Tobacco smoker: No     Systolic Blood Pressure: 132 mmHg     Is BP treated: No     HDL Cholesterol: 48 mg/dL     Total Cholesterol: 173 mg/dL  Prediabetes Lab Results  Component Value Date   HGBA1C 5.9 (H) 10/22/2022      Current Outpatient Medications:  .  Cholecalciferol (VITAMIN D3 PO),  Take by mouth., Disp: , Rfl:  .  Cyanocobalamin (VITAMIN B-12 PO), Take 1,000 mcg by mouth 2 (two) times daily., Disp: , Rfl:  .  lisinopril-hydrochlorothiazide (ZESTORETIC) 20-12.5 MG tablet, Take 1 tablet by mouth once daily, Disp: 90 tablet, Rfl: 0 .  loratadine (CLARITIN) 10 MG tablet, Take 1 tablet (10 mg total) by mouth daily., Disp: 30 tablet, Rfl: 11 .  meloxicam (MOBIC) 15 MG tablet, TAKE 1 TABLET BY MOUTH AS NEEDED, Disp: 30 tablet, Rfl: 0 .  omeprazole (PRILOSEC) 40 MG capsule, Take 1 capsule (40 mg total) by mouth 2 (two) times daily. For GI protection while taking NSAIDs, Disp: 60 capsule, Rfl: 3  Patient Active Problem List   Diagnosis Date Noted  . Hyperlipidemia 09/21/2021  . Essential hypertension, benign 11/14/2015  . Prediabetes 11/13/2015  . Benign neoplasm of rectosigmoid junction   . Family history of breast cancer 04/28/2015  . Family history of colon cancer 04/28/2015  . Obesity, Class III, BMI 40-49.9 (morbid obesity) (HCC) 04/28/2015  . Need for tetanus, diphtheria, and acellular pertussis (Tdap) vaccine in patient of adolescent age or older 04/28/2015    Past Surgical History:  Procedure Laterality Date  . CHOLECYSTECTOMY    .  COLONOSCOPY N/A 10/29/2021   Procedure: COLONOSCOPY;  Surgeon: Toney Reil, MD;  Location: [facility] ENDOSCOPY;  Service: Gastroenterology;  Laterality: N/A;  . COLONOSCOPY WITH PROPOFOL N/A 06/16/2015   Procedure: COLONOSCOPY WITH PROPOFOL;  Surgeon: Midge Minium, MD;  Location: [facility];  Service: Endoscopy;  Laterality: N/A;  . POLYPECTOMY  06/16/2015   Procedure: POLYPECTOMY INTESTINAL;  Surgeon: Midge Minium, MD;  Location: [facility];  Service: Endoscopy;;  Sigmoid colon polyp  . TUBAL LIGATION      Family History  Problem Relation Age of Onset  . Kidney disease Mother   . Hypertension Mother   . Cancer Sister        breast  . Breast cancer Sister 55  . Cancer Brother        colon  . Kidney disease  Brother   . Hypertension Father   . Cerebral palsy Brother   . Colon cancer Brother   . HIV/AIDS Brother   . Cancer Other   . Breast cancer Other     Social History   Tobacco Use  . Smoking status: Former    Current packs/day: 0.00    Types: Cigarettes    Quit date: 04/27/2005    Years since quitting: 18.2  . Smokeless tobacco: Never  Vaping Use  . Vaping status: Never Used  Substance Use Topics  . Alcohol use: Yes    Alcohol/week: 2.0 standard drinks of alcohol    Types: 2 Shots of liquor per week  . Drug use: Not Currently    Types: Marijuana    Comment: 1x/mo - last time 06/09/15     No Known Allergies  Health Maintenance  Topic Date Due  . COVID-19 Vaccine (6 - 2024-25 season) 12/08/2022  . MAMMOGRAM  06/28/2023  . INFLUENZA VACCINE  11/07/2023  . DTaP/Tdap/Td (2 - Td or Tdap) 04/27/2025  . Cervical Cancer Screening (HPV/Pap Cotest)  05/31/2025  . DEXA SCAN  01/02/2026  . Colonoscopy  10/30/2026  . Hepatitis C Screening  Completed  . HIV Screening  Completed  . Zoster Vaccines- Shingrix  Completed  . HPV VACCINES  Aged Out    Chart Review Today: ***  Review of Systems   Objective:   Vitals:   07/09/23 1103  BP: 132/72  Pulse: 91  Resp: 16  SpO2: 99%  Weight: 295 lb (133.8 kg)  Height: 5\' 5"  (1.651 m)    Body mass index is 49.09 kg/m.  Physical Exam   Functional Status Survey:   Results for orders placed or performed in visit on 11/22/22  POC Hemoccult Bld/Stl (3-Cd Home Screen)   Collection Time: 11/22/22 11:38 AM  Result Value Ref Range   Card #1 Date     Fecal Occult Blood, POC Negative Negative   Card #2 Date     Card #2 Fecal Occult Blod, POC Negative    Card #3 Date     Card #3 Fecal Occult Blood, POC Negative   BASIC METABOLIC PANEL WITH GFR   Collection Time: 12/23/22 11:09 AM  Result Value Ref Range   Glucose, Bld 91 65 - 99 mg/dL   BUN 21 7 - 25 mg/dL   Creat 1.61 0.96 - 0.45 mg/dL   eGFR 62 > OR = 60 WU/JWJ/1.91Y7    BUN/Creatinine Ratio SEE NOTE: 6 - 22 (calc)   Sodium 138 135 - 146 mmol/L   Potassium 4.1 3.5 - 5.3 mmol/L   Chloride 106 98 - 110 mmol/L   CO2 22  20 - 32 mmol/L   Calcium 9.7 8.6 - 10.4 mg/dL  CBC with Differential/Platelet   Collection Time: 12/23/22 11:09 AM  Result Value Ref Range   WBC 4.0 3.8 - 10.8 Thousand/uL   RBC 4.11 3.80 - 5.10 Million/uL   Hemoglobin 12.3 11.7 - 15.5 g/dL   HCT 46.9 62.9 - 52.8 %   MCV 91.2 80.0 - 100.0 fL   MCH 29.9 27.0 - 33.0 pg   MCHC 32.8 32.0 - 36.0 g/dL   RDW 41.3 24.4 - 01.0 %   Platelets 306 140 - 400 Thousand/uL   MPV 9.5 7.5 - 12.5 fL   Neutro Abs 1,484 (L) 1,500 - 7,800 cells/uL   Lymphs Abs 1,740 850 - 3,900 cells/uL   Absolute Monocytes 536 200 - 950 cells/uL   Eosinophils Absolute 200 15 - 500 cells/uL   Basophils Absolute 40 0 - 200 cells/uL   Neutrophils Relative % 37.1 %   Total Lymphocyte 43.5 %   Monocytes Relative 13.4 %   Eosinophils Relative 5.0 %   Basophils Relative 1.0 %  T4, free   Collection Time: 12/23/22 11:09 AM  Result Value Ref Range   Free T4 1.2 0.8 - 1.8 ng/dL  TSH   Collection Time: 12/23/22 11:09 AM  Result Value Ref Range   TSH 1.73 0.40 - 4.50 mIU/L  Vitamin B12   Collection Time: 12/23/22 11:09 AM  Result Value Ref Range   Vitamin B-12 1,826 (H) 200 - 1,100 pg/mL      Assessment & Plan:   There are no diagnoses linked to this encounter.   No follow-ups on file.   Danelle Berry, PA-C 07/09/23 11:15 AM

## 2023-07-10 ENCOUNTER — Encounter: Payer: Self-pay | Admitting: Family Medicine

## 2023-07-10 ENCOUNTER — Encounter (INDEPENDENT_AMBULATORY_CARE_PROVIDER_SITE_OTHER): Payer: Self-pay

## 2023-07-10 LAB — COMPREHENSIVE METABOLIC PANEL WITH GFR
AG Ratio: 1.5 (calc) (ref 1.0–2.5)
ALT: 18 U/L (ref 6–29)
AST: 22 U/L (ref 10–35)
Albumin: 4.1 g/dL (ref 3.6–5.1)
Alkaline phosphatase (APISO): 93 U/L (ref 37–153)
BUN: 15 mg/dL (ref 7–25)
CO2: 26 mmol/L (ref 20–32)
Calcium: 9.2 mg/dL (ref 8.6–10.4)
Chloride: 107 mmol/L (ref 98–110)
Creat: 0.87 mg/dL (ref 0.50–1.05)
Globulin: 2.7 g/dL (ref 1.9–3.7)
Glucose, Bld: 89 mg/dL (ref 65–99)
Potassium: 4.1 mmol/L (ref 3.5–5.3)
Sodium: 140 mmol/L (ref 135–146)
Total Bilirubin: 0.3 mg/dL (ref 0.2–1.2)
Total Protein: 6.8 g/dL (ref 6.1–8.1)
eGFR: 75 mL/min/{1.73_m2} (ref >=60)

## 2023-07-10 LAB — HEMOGLOBIN A1C
Hgb A1c MFr Bld: 5.8 %{Hb} — ABNORMAL HIGH (ref <5.7)
Mean Plasma Glucose: 120 mg/dL
eAG (mmol/L): 6.6 mmol/L

## 2023-07-10 LAB — VITAMIN B12: Vitamin B-12: 733 pg/mL (ref 200–1100)

## 2023-07-10 MED ORDER — QSYMIA 7.5-46 MG PO CP24: 1.0000 | ORAL_CAPSULE | Freq: Every morning | ORAL | 0 refills | Status: DC

## 2023-07-10 MED ORDER — PHENTERMINE-TOPIRAMATE ER 3.75-23 MG PO CP24: 1.0000 | ORAL_CAPSULE | Freq: Every morning | ORAL | 0 refills | Status: DC

## 2023-07-10 NOTE — Assessment & Plan Note (Signed)
 Pt has cut back meds to 1/4th of a pill every other day, concerns of SE with ACEI (cough) and hydrochlorothiazide (muscle spasms/cramps)  BP Readings from Last 3 Encounters:  07/09/23 132/72  11/19/22 116/78  10/22/22 102/74  BP near goal today Plan to have her D/C med completely and do BP recheck in 2-3 weeks to see if we need to start a different meds, losartan?  Encouraged diet/lifestyle efforts

## 2023-07-10 NOTE — Assessment & Plan Note (Signed)
 Worsening obesity since retiring She is working on diet, not as active Counseling for diet/nutrition and weight loss done today Discussed med options - advised that she call insurance for covered meds/options Discussed med weight management referral or RD consult

## 2023-07-10 NOTE — Assessment & Plan Note (Signed)
 Recheck labs with worsening weight/BMI diet

## 2023-07-10 NOTE — Assessment & Plan Note (Signed)
 Not on meds, recent labs reviewed

## 2023-07-30 ENCOUNTER — Encounter: Payer: Self-pay | Admitting: Family Medicine

## 2023-07-30 ENCOUNTER — Telehealth: Admitting: Family Medicine

## 2023-07-30 VITALS — BP 112/72 | Ht 64.0 in | Wt 295.0 lb

## 2023-07-30 DIAGNOSIS — I10 Essential (primary) hypertension: Secondary | ICD-10-CM | POA: Diagnosis not present

## 2023-07-30 DIAGNOSIS — R002 Palpitations: Secondary | ICD-10-CM

## 2023-07-30 NOTE — Progress Notes (Signed)
 Name: Amber Murray   MRN: [id number]    DOB: [birth year]   Date:07/30/2023       Progress Note  Subjective:    Chief Complaint  Chief Complaint  Patient presents with   Medical Management of Chronic Issues   Hypertension    Reports palpitations with no other symptoms and she does monitor at home with reading ranging from 112-134/72-82    I connected with  Arville Laughter  on 07/30/23 at 11:00 AM EDT by a video enabled telemedicine application and verified that I am speaking with the correct person using two identifiers.  I discussed the limitations of evaluation and management by telemedicine and the availability of in person appointments. The patient expressed understanding and agreed to proceed. Staff also discussed with the patient that there may be a patient responsible charge related to this service. Patient Location: home Provider Location: [facility] Additional Individuals present: none  HPI BP she has stopped her meds and monitored BP at home 112-134/72-82 Meds previously stopped due to med SE and near goal BP with pt taking only a 1/4th of her pills   Patient Active Problem List   Diagnosis Date Noted   Hyperlipidemia 09/21/2021   Essential hypertension, benign 11/14/2015   Prediabetes 11/13/2015   Benign neoplasm of rectosigmoid junction    Family history of breast cancer 04/28/2015   Family history of colon cancer 04/28/2015   Obesity, Class III, BMI 40-49.9 (morbid obesity) (HCC) 04/28/2015   Need for tetanus, diphtheria, and acellular pertussis (Tdap) vaccine in patient of adolescent age or older 04/28/2015    Social History   Tobacco Use   Smoking status: Former    Current packs/day: 0.00    Types: Cigarettes    Quit date: 04/27/2005    Years since quitting: 18.2   Smokeless tobacco: Never  Substance Use Topics   Alcohol use: Yes    Alcohol/week: 2.0 standard drinks of alcohol    Types: 2 Shots of liquor per week     Current  Outpatient Medications:    Cholecalciferol (VITAMIN D3 PO), Take by mouth., Disp: , Rfl:    Cyanocobalamin (VITAMIN B-12 PO), Take 1,000 mcg by mouth 2 (two) times daily., Disp: , Rfl:    loratadine  (CLARITIN ) 10 MG tablet, Take 1 tablet (10 mg total) by mouth daily., Disp: 30 tablet, Rfl: 11   meloxicam  (MOBIC ) 15 MG tablet, TAKE 1 TABLET BY MOUTH AS NEEDED (Patient not taking: Reported on 07/30/2023), Disp: 30 tablet, Rfl: 0   Phentermine -Topiramate  (QSYMIA ) 7.5-46 MG CP24, Take 1 capsule by mouth in the morning. (Patient not taking: Reported on 07/30/2023), Disp: 84 capsule, Rfl: 0   Phentermine -Topiramate  3.75-23 MG CP24, Take 1 capsule by mouth in the morning for 14 days., Disp: 14 capsule, Rfl: 0  Allergies  Allergen Reactions   Hydrochlorothiazide  Other (See Comments)    Severe muscle spasms and cramps    Lisinopril  Cough    I personally reviewed active problem list, medication list, allergies, family history, social history, health maintenance, notes from last encounter, lab results, imaging with the patient/caregiver today.   Review of Systems  All other systems reviewed and are negative.     Objective:   Virtual encounter, vitals limited, only able to obtain the following Today's Vitals   07/30/23 1116  BP: 112/72  Weight: 295 lb (133.8 kg)  Height: 5\' 4"  (1.626 m)   Body mass index is 50.64 kg/m. Nursing Note and Vital Signs reviewed.  Physical Exam  Vitals and nursing note reviewed.  Pulmonary:     Effort: No respiratory distress.  Neurological:     Mental Status: She is alert.  Psychiatric:        Mood and Affect: Mood normal.     PE limited by virtual encounter  No results found for this or any previous visit (from the past 72 hours).  Assessment and Plan:   1. Essential hypertension, benign (Primary) Off meds BP is still fairly well controlled and at goal Continue diet/lifestyle efforts and as long as average BP is <130/80 can stay off meds  2.  Palpitations She mentions at end of visit palpitations - we did recently try to get weight loss meds approved and they do contain phentermine  - with new palpitations she was recommended to come into office for OV/VS/ECG to evaluate and she was also advised to not start meds until this is done and she is cleared to take the meds - at the time of prescribing she was not having any palpitations or cardiac sx    - I discussed the assessment and treatment plan with the patient. The patient was provided an opportunity to ask questions and all were answered. The patient agreed with the plan and demonstrated an understanding of the instructions.  I provided 15 minutes of non-face-to-face time during this encounter.  Adeline Hone, PA-C 07/30/23 11:32 AM

## 2023-08-14 ENCOUNTER — Encounter: Payer: Self-pay | Admitting: Family Medicine

## 2023-09-23 NOTE — Progress Notes (Signed)
 Office: [phone]  /  Fax: [phone]   Initial Visit    Amber Murray was seen in clinic today to evaluate for obesity. She is interested in losing weight to improve overall health and reduce the risk of weight related complications. She presents today to review program treatment options, initial physical assessment, and evaluation.      She was referred by: PCP  When asked what else they would like to accomplish? She states: Adopt healthier eating patterns, Improve energy levels and physical activity, Improve existing medical conditions, Improve quality of life, Improve appearance, and wants to get healthy and feel good again and look good.   When asked how has your weight affected you? She states: Has affected self-esteem, Relationships, Contributed to medical problems, Contributed to orthopedic problems or mobility issues, Having fatigue, Having poor endurance, Problems with eating patterns, and Has affected mood   Weight history: Amber Murray is a 63 year old female who presents for initial obesity treatment evaluation.  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   Her initial weight issues began after her second pregnancy at age 68 and quitting smoking, which led to a notable increase in weight. She describes her family as 'a big family'.  Since retirement, she has experienced a decrease in physical activity, contributing to her weight gain. Her activity level has dropped significantly, and she now experiences breathlessness, knee and hip pain, and fatigue, which she attributes to her weight. She also reports poor endurance and increased resting compared to before.  Her current weight management efforts include a  low carbohydrate diet and yoga five days a week for about 20 minutes each session. She has attempted intermittent fasting and Optifast in the past without significant weight loss. She is not currently on any weight loss medications due to insurance denial of Qsymia .  She has a history of hypertension, which is described as 'on and off', and is currently taking only Claritin . She has been informed that she is prediabetic. No history of high cholesterol, fatty liver disease, sleep apnea, gastroesophageal reflux disease, overactive bladder, polycystic ovary syndrome, heart disease, lung disease, kidney disease, vitamin D  deficiency, or connective tissue disease.  She experiences some snoring and has varicose veins. She has not been diagnosed with sleep apnea but acknowledges a history of snoring. She denies any significant mental health issues but notes that her weight has affected her self-esteem and relationships, particularly with her daughter, due to self-consciousness about her size.  Highest weight: 330 lb  Some associated conditions: Hypertension, Arthritis:Knees, Prediabetes, and Venous insufficiency  Contributing factors: family history of obesity, disruption of circadian rhythm / sleep disordered breathing, consumption of processed foods, reduced physical acitivity, eating patterns, menopause, slow metabolism for age, need for convenience due to lack of time, and enticing relationships and enviroment  Weight promoting medications identified: None  Prior weight loss attempts: Optifast and Intermittent fasting- Not helpful  Current nutrition plan: Low-carb  Current level of physical activity: Yoga 20 minutes, five and Other: Stretching program  Current or previous pharmacotherapy: None PCP ordered Qsymia  but insurance did not cover  Response to medication: Never tried medications   Past medical history includes:   Past Medical History:  Diagnosis Date  GERD (gastroesophageal reflux  disease)    occasional    HLD (hyperlipidemia)    Hypertension    Obesity    Prediabetes 11/13/2015   Vertigo    noo episodes in several yrs   Wears contact lenses    sometimes     Objective    BP 131/89   Pulse (!) 56   Temp 98.4 F (36.9 C)   Ht 5' 5 (1.651 m)   Wt 293 lb (132.9 kg)   SpO2 98%   BMI 48.76 kg/m  She was weighed on the bioimpedance scale: Body mass index is 48.76 kg/m.  Body Fat%:50.3%, Visceral Fat Rating:19, Weight trend over the last 12 months: Increasing  General:  Alert, oriented and cooperative. Patient is in no acute distress.  Respiratory: Normal respiratory effort, no problems with respiration noted   Gait: able to ambulate independently  Mental Status: Normal mood and affect. Normal behavior. Normal judgment and thought content.   DIAGNOSTIC DATA REVIEWED:  BMET    Component Value Date/Time   NA 140 07/09/2023 1145   NA 140 05/04/2015 0910   K 4.1 07/09/2023 1145   CL 107 07/09/2023 1145   CO2 26 07/09/2023 1145   GLUCOSE 89 07/09/2023 1145   BUN 15 07/09/2023 1145   BUN 11 05/04/2015 0910   CREATININE 0.87 07/09/2023 1145   CALCIUM 9.2 07/09/2023 1145   GFRNONAA 97 05/31/2020 1129   GFRAA 113 05/31/2020 1129   Lab Results  Component Value Date   HGBA1C 5.8 (H) 07/09/2023   HGBA1C 5.7 (H) 05/04/2015   No results found for: INSULIN CBC    Component Value Date/Time   WBC 4.0 12/23/2022 1109   RBC 4.11 12/23/2022 1109   HGB 12.3 12/23/2022 1109   HGB 14.5 05/25/2015 1043   HCT 37.5 12/23/2022 1109   HCT 43.0 05/25/2015 1043   PLT 306 12/23/2022 1109   PLT 267 05/25/2015 1043   MCV 91.2 12/23/2022 1109   MCV 89 05/25/2015 1043   MCH 29.9 12/23/2022 1109   MCHC 32.8 12/23/2022 1109   RDW 12.2 12/23/2022 1109   RDW 12.8 05/25/2015 1043   Iron/TIBC/Ferritin/ %Sat    Component Value Date/Time   IRON 77 11/19/2022 0902   TIBC 283 11/19/2022 0902   FERRITIN 440 (H) 11/19/2022 0902   IRONPCTSAT 27 11/19/2022 0902    Lipid Panel     Component Value Date/Time   CHOL 173 10/22/2022 1448   CHOL 161 05/04/2015 0910   TRIG 134 10/22/2022 1448   HDL 48 (L) 10/22/2022 1448   HDL 53 05/04/2015 0910   CHOLHDL 3.6 10/22/2022 1448   VLDL 31 (H) 12/18/2015 1643   LDLCALC 102 (H) 10/22/2022 1448   Hepatic Function Panel     Component Value Date/Time   PROT 6.8 07/09/2023 1145   PROT 6.9 05/04/2015 0910   ALBUMIN 4.1 11/13/2015 1618   ALBUMIN 4.1 05/04/2015 0910   AST 22 07/09/2023 1145   ALT 18 07/09/2023 1145   ALKPHOS 87 11/13/2015 1618   BILITOT 0.3 07/09/2023 1145   BILITOT 0.4 05/04/2015 0910      Component Value Date/Time   TSH 1.73 12/23/2022 1109     Assessment and Plan   Hyperlipidemia, unspecified hyperlipidemia type  Essential hypertension, benign  Prediabetes  Class 3 severe obesity due to excess calories with serious comorbidity and body mass index (BMI) of 45.0 to 49.9 in adult   Current BMI 48.8 Assessment and Plan Assessment & Plan Obesity Amber Radar  Murray has gained 40 pounds in the last year, with a family history of obesity and decreased physical activity post-retirement. Previous weight loss attempts, including intermittent fasting and Optifast, were unsuccessful. She is not on weight loss medications due to insurance denial of Qsymia . The program focuses on nutritional changes, emphasizing a balance of proteins, carbohydrates, and fats, and the potential use of medications if necessary. Initial testing includes fasting labs and a metabolism test to tailor dietary recommendations. The goal is to reduce body fat percentage to 35% or less and visceral fat rating to 12 or less to decrease the risk of chronic diseases such as heart disease, type 2 diabetes, and certain cancers. - Focus on low-carb, lean protein intake. - Start walking, consider using YouTube videos for indoor walking exercises. - Read food labels to monitor carbohydrate and sugar intake. - Schedule an  appointment for a comprehensive obesity management program. - Conduct fasting labs and metabolism test to tailor dietary recommendations.  Prediabetes Amber Murray is prediabetic with suspected insulin resistance. Addressing her weight and dietary habits is crucial to prevent progression to type 2 diabetes. The program includes testing for insulin resistance and other metabolic parameters. - Conduct fasting labs to assess insulin resistance and other metabolic parameters. - Implement dietary changes focusing on low-carb intake as part of the obesity management plan.  Hypertension Amber Murray has hypertension, currently not managed with medication. Her blood pressure was elevated during the visit. Weight loss and dietary changes are expected to help manage her blood pressure. - Monitor blood pressure regularly. - Implement dietary changes as part of the obesity management plan.  Arthritis Amber Murray reports knee pain, likely due to arthritis, exacerbated by her current weight and decreased physical activity. Weight loss and increased physical activity are expected to alleviate some of the symptoms. - Encourage weight loss and increased physical activity to alleviate knee pain.  General Health Maintenance Amber Murray is encouraged to adopt healthier lifestyle habits, including dietary changes and increased physical activity, to improve overall health and prevent chronic diseases. The program emphasizes nutritional balance and regular physical activity. - Encourage a balanced diet with appropriate protein, carbohydrate, and fat intake. - Promote regular physical activity, including walking and yoga.  Follow-up Amber Murray is interested in participating in an obesity management program but faces a delay in appointment availability until September. In the meantime, she is advised to focus on dietary and lifestyle changes. - Schedule a new patient appointment for the obesity management program in September. - Provide a new  patient packet to be completed before the appointment.        Obesity Treatment / Action Plan:  Patient will work on garnering support from family and friends to begin weight loss journey. Will work on eliminating or reducing the presence of highly palatable, calorie dense foods in the home. Will complete provided nutritional and psychosocial assessment questionnaire before the next appointment. Will be scheduled for indirect calorimetry to determine resting energy expenditure in a fasting state.  This will allow us  to create a reduced calorie, high-protein meal plan to promote loss of fat mass while preserving muscle mass. Will think about ideas on how to incorporate physical activity into their daily routine. Will work on reducing intake of added sugars, simple sugars and processed carbs. Will avoid skipping meals which may result in increased hunger signals and overeating at certain times. Will reduce the frequency of eating out and making healthier choices by advanced menu planning. Will work on reading labels, making healthier choices and watching  portion sizes. Counseled on the health benefits of losing 5%-15% of total body weight. Will work on improving sleep hygiene and trying to obtain at least 7 hours of sleep. Was counseled on nutritional approaches to weight loss and benefits of reducing processed foods and consuming plant-based foods and high quality protein as part of nutritional weight management. Was counseled on pharmacotherapy and role as an adjunct in weight management.  Will work on increasing water intake with a goal of 125 ounces for men and 91 ounces for women.   Obesity Education Performed Today:  She was weighed on the bioimpedance scale and results were discussed and documented in the synopsis.  We discussed obesity as a disease and the importance of a more detailed evaluation of all the factors contributing to the disease.  We discussed the importance of long  term lifestyle changes which include nutrition, exercise and behavioral modifications as well as the importance of customizing this to her specific health and social needs.  We discussed the benefits of reaching a healthier weight to alleviate the symptoms of existing conditions and reduce the risks of the biomechanical, metabolic and psychological effects of obesity.  We reviewed the four pillars of obesity medicine and importance of using a multimodal approach.  We reviewed the basic principles in weight management.   Amber Murray appears to be in the action stage of change and states they are ready to start intensive lifestyle modifications and behavioral modifications.  I have spent 38 minutes in the care of the patient today including: 3 minutes before the visit reviewing and preparing the chart. 30 minutes face-to-face assessing and reviewing listed medical problems as outlined in obesity care plan, providing nutritional and behavioral counseling on topics outlined in the obesity care plan, counseling regarding anti-obesity medication as outlined in obesity care plan, independently interpreting test results and goals of care, as described in assessment and plan, and reviewing and discussing biometric information and progress 5 minutes after the visit updating chart and documentation of encounter.  Reviewed by clinician on day of visit: allergies, medications, problem list, medical history, surgical history, family history, social history, and previous encounter notes pertinent to obesity diagnosis.  Mariane Burpee,PA-C

## 2023-09-24 ENCOUNTER — Other Ambulatory Visit (HOSPITAL_COMMUNITY): Payer: Self-pay

## 2023-09-24 ENCOUNTER — Ambulatory Visit (INDEPENDENT_AMBULATORY_CARE_PROVIDER_SITE_OTHER): Admitting: Physician Assistant

## 2023-09-24 ENCOUNTER — Encounter (INDEPENDENT_AMBULATORY_CARE_PROVIDER_SITE_OTHER): Payer: Self-pay | Admitting: Physician Assistant

## 2023-09-24 VITALS — BP 131/89 | HR 56 | Temp 98.4°F | Ht 65.0 in | Wt 293.0 lb

## 2023-09-24 DIAGNOSIS — E785 Hyperlipidemia, unspecified: Secondary | ICD-10-CM

## 2023-09-24 DIAGNOSIS — R7303 Prediabetes: Secondary | ICD-10-CM | POA: Diagnosis not present

## 2023-09-24 DIAGNOSIS — D127 Benign neoplasm of rectosigmoid junction: Secondary | ICD-10-CM

## 2023-09-24 DIAGNOSIS — I10 Essential (primary) hypertension: Secondary | ICD-10-CM | POA: Diagnosis not present

## 2023-09-24 DIAGNOSIS — E66813 Obesity, class 3: Secondary | ICD-10-CM | POA: Diagnosis not present

## 2023-09-24 DIAGNOSIS — Z6841 Body Mass Index (BMI) 40.0 and over, adult: Secondary | ICD-10-CM

## 2023-10-06 ENCOUNTER — Encounter (INDEPENDENT_AMBULATORY_CARE_PROVIDER_SITE_OTHER): Payer: Self-pay

## 2023-10-06 DIAGNOSIS — Z0289 Encounter for other administrative examinations: Secondary | ICD-10-CM

## 2023-10-24 ENCOUNTER — Encounter: Payer: Self-pay | Admitting: Family Medicine

## 2023-10-24 ENCOUNTER — Ambulatory Visit (INDEPENDENT_AMBULATORY_CARE_PROVIDER_SITE_OTHER): Admitting: Family Medicine

## 2023-10-24 VITALS — BP 134/70 | HR 86 | Resp 17 | Ht 65.0 in | Wt 295.0 lb

## 2023-10-24 DIAGNOSIS — R7303 Prediabetes: Secondary | ICD-10-CM

## 2023-10-24 DIAGNOSIS — E538 Deficiency of other specified B group vitamins: Secondary | ICD-10-CM

## 2023-10-24 DIAGNOSIS — Z0000 Encounter for general adult medical examination without abnormal findings: Secondary | ICD-10-CM | POA: Diagnosis not present

## 2023-10-24 DIAGNOSIS — I10 Essential (primary) hypertension: Secondary | ICD-10-CM

## 2023-10-24 NOTE — Progress Notes (Signed)
 Patient: Amber Murray, Female    DOB: Mar 12, 1961, 63 y.o.   MRN: [id number] Leavy Mole, PA-C Visit Date: 10/24/2023  Today's Provider: Mole Leavy, PA-C   Chief Complaint  Patient presents with   Annual Exam   Subjective:   Annual physical exam:  Amber Murray is a 63 y.o. female who presents today for complete physical exam:  Exercise/Activity:  walking to the mailbox 1-2 x a week 1/3 mile away  Diet/nutrition:    not counting calories yet, cut out soda and cutting back on starches  Sleep:   no concerns   Discussed the use of AI scribe software for clinical note transcription with the patient, who gave verbal consent to proceed.  History of Present Illness Amber Murray is a 63 year old female who presents for weight management and evaluation of hip pain.  Weight management - Initiated weight management program on September 3rd - Focus on reducing starch intake and eliminating sodas - Engages in physical activity: walks to mailbox (approximately one-third mile round trip) two to three times per week - Practices yoga for mobility  Right hip pain and sciatica symptoms - Right hip pain with possible sciatica - Pain occurs during walks and affects hip and lateral aspect - Pain limits ability to exercise - Previously received chiropractic care, not currently seeing an orthopedic specialist - Considering finding a female chiropractor  Sleep and bowel/bladder function - Sleep is adequate without disturbances - No significant changes in bowel habits - Occasional urinary urgency associated with high fluid intake    SDOH Screenings   Food Insecurity: No Food Insecurity (10/21/2023)  Housing: Low Risk  (10/21/2023)  Transportation Needs: No Transportation Needs (10/21/2023)  Utilities: Not At Risk (10/24/2023)  Alcohol Screen: Low Risk  (10/21/2023)  Depression (PHQ2-9): Low Risk  (10/24/2023)  Financial Resource Strain: Low Risk  (10/21/2023)   Physical Activity: Insufficiently Active (10/21/2023)  Social Connections: Socially Integrated (10/21/2023)  Stress: No Stress Concern Present (10/21/2023)  Tobacco Use: Medium Risk (10/24/2023)  Health Literacy: Adequate Health Literacy (10/24/2023)     USPSTF grade A and B recommendations - reviewed and addressed today  Depression:  Phq 9 completed today by patient, was reviewed by me with patient in the room PHQ score is neg, pt feels mood good    10/24/2023    1:11 PM 07/09/2023   11:07 AM 10/22/2022    2:07 PM 08/15/2022   10:50 AM  PHQ 2/9 Scores  PHQ - 2 Score 0 0 0 0  PHQ- 9 Score   0 0      10/24/2023    1:11 PM 07/09/2023   11:07 AM 10/22/2022    2:07 PM 08/15/2022   10:50 AM 04/03/2022    1:56 PM  Depression screen PHQ 2/9  Decreased Interest 0 0 0 0 0  Down, Depressed, Hopeless 0 0 0 0 0  PHQ - 2 Score 0 0 0 0 0  Altered sleeping   0 0 0  Tired, decreased energy   0 0 0  Change in appetite   0 0 0  Feeling bad or failure about yourself    0 0 0  Trouble concentrating   0 0 0  Moving slowly or fidgety/restless   0 0 0  Suicidal thoughts   0 0 0  PHQ-9 Score   0 0 0  Difficult doing work/chores   Not difficult at all Not difficult at all     Alcohol screening:  Flowsheet Row Office Visit from 10/24/2023 in [facility]  AUDIT-C Score 2     Immunizations and Health Maintenance: Health Maintenance  Topic Date Due   COVID-19 Vaccine (6 - 2024-25 season) 11/08/2023 (Originally 12/08/2022)   INFLUENZA VACCINE  11/07/2023   MAMMOGRAM  07/07/2024   DTaP/Tdap/Td (2 - Td or Tdap) 04/27/2025   Cervical Cancer Screening (HPV/Pap Cotest)  05/31/2025   DEXA SCAN  01/02/2026   Colonoscopy  10/30/2026   Hepatitis C Screening  Completed   HIV Screening  Completed   Zoster Vaccines- Shingrix   Completed   Hepatitis B Vaccines  Aged Out   HPV VACCINES  Aged Out   Meningococcal B Vaccine  Aged Out     Hep C Screening: done  STD testing and prevention  (HIV/chl/gon/syphilis):  see above, no additional testing desired by pt today  Intimate partner violence:  safe at home   Sexual History/Pain during Intercourse: Married  Menstrual History/LMP/Abnormal Bleeding:  denies AUB No LMP recorded. Patient is postmenopausal.  Incontinence Symptoms: mild sx   Breast cancer: done April, due annually Last Mammogram: *see HM list above BRCA gene screening: n/a  Cervical cancer screening: UTD  Osteoporosis:   Discussion on osteoporosis per age, including high calcium and vitamin D  supplementation, weight bearing exercises  Skin cancer:  Hx of skin CA -  NO   Discussed atypical lesions   Colorectal cancer:   Colonoscopy is UTD   Discussed concerning signs and sx of CRC, pt denies change in bowels, blood in stoo;  Lung cancer:   Low Dose CT Chest recommended if Age 15-80 years, 20 pack-year currently smoking OR have quit w/in 15years. Patient does not qualify.    Social History   Tobacco Use   Smoking status: Former    Current packs/day: 0.00    Types: Cigarettes    Quit date: 04/27/2005    Years since quitting: 18.5   Smokeless tobacco: Never  Vaping Use   Vaping status: Never Used  Substance Use Topics   Alcohol use: Yes    Alcohol/week: 2.0 standard drinks of alcohol    Types: 2 Shots of liquor per week   Drug use: Not Currently    Types: Marijuana    Comment: 1x/mo - last time 06/09/15     Flowsheet Row Office Visit from 10/24/2023 in [facility]  AUDIT-C Score 2     Family History  Problem Relation Age of Onset   Kidney disease Mother    Hypertension Mother    Arthritis Mother    Obesity Mother    Cancer Sister        breast   Breast cancer Sister 83   Cancer Brother        colon   Kidney disease Brother    Hypertension Father    Arthritis Father    Obesity Father    Stroke Father    Varicose Veins Father    Cerebral palsy Brother    Colon cancer Brother    HIV/AIDS Brother     Cancer Other    Breast cancer Other      Blood pressure/Hypertension: BP Readings from Last 3 Encounters:  10/24/23 134/70  09/24/23 131/89  07/30/23 112/72   Weight/Obesity: Wt Readings from Last 8 Encounters:  10/24/23 295 lb (133.8 kg)  09/24/23 293 lb (132.9 kg)  07/30/23 295 lb (133.8 kg)  07/09/23 295 lb (133.8 kg)  11/19/22 277 lb 4.8 oz (125.8 kg)  10/22/22 268 lb 6.4 oz (121.7 kg)  08/15/22 266 lb 14.4 oz (121.1 kg)  04/03/22 276 lb (125.2 kg)   BMI Readings from Last 3 Encounters:  10/24/23 49.09 kg/m  09/24/23 48.76 kg/m  07/30/23 50.64 kg/m    Lipids:  Lab Results  Component Value Date   CHOL 173 10/22/2022   CHOL 192 03/15/2022   CHOL 224 (H) 09/13/2021   Lab Results  Component Value Date   HDL 48 (L) 10/22/2022   HDL 58 03/15/2022   HDL 60 09/13/2021   Lab Results  Component Value Date   LDLCALC 102 (H) 10/22/2022   LDLCALC 114 (H) 03/15/2022   LDLCALC 145 (H) 09/13/2021   Lab Results  Component Value Date   TRIG 134 10/22/2022   TRIG 92 03/15/2022   TRIG 89 09/13/2021   Lab Results  Component Value Date   CHOLHDL 3.6 10/22/2022   CHOLHDL 3.3 03/15/2022   CHOLHDL 3.7 09/13/2021   No results found for: LDLDIRECT Based on the results of lipid panel his/her cardiovascular risk factor ( using Poole Cohort )  in the next 10 years is: The 10-year ASCVD risk score (Arnett DK, et al., 2019) is: 6.9%   Values used to calculate the score:     Age: 77 years     Clincally relevant sex: Female     Is Non-Hispanic African American: Yes     Diabetic: No     Tobacco smoker: No     Systolic Blood Pressure: 134 mmHg     Is BP treated: No     HDL Cholesterol: 48 mg/dL     Total Cholesterol: 173 mg/dL  Glucose:  Glucose, Bld  Date Value Ref Range Status  07/09/2023 89 65 - 99 mg/dL Final    Comment:    .            Fasting reference interval .   12/23/2022 91 65 - 99 mg/dL Final    Comment:    .            Fasting reference  interval .   11/19/2022 94 65 - 99 mg/dL Final    Comment:    .            Fasting reference interval .     Advanced Care Planning:  A voluntary discussion about advance care planning including the explanation and discussion of advance directives.   Discussed health care proxy and Living will, and the patient was able to identify a health care proxy as husband.   Patient does not have a living will at present time.   Social History       Social History   Socioeconomic History   Marital status: Married    Spouse name: John   Number of children: 3   Years of education: 12   Highest education level: 12th grade  Occupational History   Not on file  Tobacco Use   Smoking status: Former    Current packs/day: 0.00    Types: Cigarettes    Quit date: 04/27/2005    Years since quitting: 18.5   Smokeless tobacco: Never  Vaping Use   Vaping status: Never Used  Substance and Sexual Activity   Alcohol use: Yes    Alcohol/week: 2.0 standard drinks of alcohol    Types: 2 Shots of liquor per week   Drug use: Not Currently    Types: Marijuana    Comment: 1x/mo - last time 06/09/15   Sexual  activity: Yes    Birth control/protection: Post-menopausal  Other Topics Concern   Not on file  Social History Narrative   Not on file   Social Drivers of Health   Financial Resource Strain: Low Risk  (10/21/2023)   Overall Financial Resource Strain (CARDIA)    Difficulty of Paying Living Expenses: Not hard at all  Food Insecurity: No Food Insecurity (10/21/2023)   Hunger Vital Sign    Worried About Running Out of Food in the Last Year: Never true    Ran Out of Food in the Last Year: Never true  Transportation Needs: No Transportation Needs (10/21/2023)   PRAPARE - Administrator, Civil Service (Medical): No    Lack of Transportation (Non-Medical): No  Physical Activity: Insufficiently Active (10/21/2023)   Exercise Vital Sign    Days of Exercise per Week: 2 days    Minutes of  Exercise per Session: 10 min  Stress: No Stress Concern Present (10/21/2023)   [employer] of Occupational Health - Occupational Stress Questionnaire    Feeling of Stress: Not at all  Social Connections: Socially Integrated (10/21/2023)   Social Connection and Isolation Panel    Frequency of Communication with Friends and Family: Three times a week    Frequency of Social Gatherings with Friends and Family: Three times a week    Attends Religious Services: More than 4 times per year    Active Member of Clubs or Organizations: Yes    Attends Engineer, structural: More than 4 times per year    Marital Status: Married    Family History        Family History  Problem Relation Age of Onset   Kidney disease Mother    Hypertension Mother    Arthritis Mother    Obesity Mother    Cancer Sister        breast   Breast cancer Sister 73   Cancer Brother        colon   Kidney disease Brother    Hypertension Father    Arthritis Father    Obesity Father    Stroke Father    Varicose Veins Father    Cerebral palsy Brother    Colon cancer Brother    HIV/AIDS Brother    Cancer Other    Breast cancer Other     Patient Active Problem List   Diagnosis Date Noted   Hyperlipidemia 09/21/2021   Essential hypertension, benign 11/14/2015   Prediabetes 11/13/2015   Benign neoplasm of rectosigmoid junction    Family history of breast cancer 04/28/2015   Family history of colon cancer 04/28/2015   Obesity, Class III, BMI 40-49.9 (morbid obesity) 04/28/2015   Need for tetanus, diphtheria, and acellular pertussis (Tdap) vaccine in patient of adolescent age or older 04/28/2015    Past Surgical History:  Procedure Laterality Date   CHOLECYSTECTOMY     COLONOSCOPY N/A 10/29/2021   Procedure: COLONOSCOPY;  Surgeon: Unk Corinn Skiff, MD;  Location: [facility] ENDOSCOPY;  Service: Gastroenterology;  Laterality: N/A;   COLONOSCOPY WITH PROPOFOL  N/A 06/16/2015   Procedure: COLONOSCOPY WITH  PROPOFOL ;  Surgeon: Rogelia Copping, MD;  Location: [facility];  Service: Endoscopy;  Laterality: N/A;   POLYPECTOMY  06/16/2015   Procedure: POLYPECTOMY INTESTINAL;  Surgeon: Rogelia Copping, MD;  Location: [facility];  Service: Endoscopy;;  Sigmoid colon polyp   TUBAL LIGATION       Current Outpatient Medications:    Cyanocobalamin  (VITAMIN B-12 PO), Take 1,000  mcg by mouth 2 (two) times daily., Disp: , Rfl:    loratadine  (CLARITIN ) 10 MG tablet, Take 1 tablet (10 mg total) by mouth daily., Disp: 30 tablet, Rfl: 11  Allergies  Allergen Reactions   Hydrochlorothiazide  Other (See Comments)    Severe muscle spasms and cramps    Lisinopril  Cough    Patient Care Team: Deran Barro, PA-C as PCP - General (Family Medicine)   Chart Review: I personally reviewed active problem list, medication list, allergies, family history, social history, health maintenance, notes from last encounter, lab results, imaging with the patient/caregiver today.   Review of Systems  Constitutional: Negative.   HENT: Negative.    Eyes: Negative.   Respiratory: Negative.    Cardiovascular: Negative.   Gastrointestinal: Negative.   Endocrine: Negative.   Genitourinary: Negative.   Musculoskeletal: Negative.   Skin: Negative.   Allergic/Immunologic: Negative.   Neurological: Negative.   Hematological: Negative.   Psychiatric/Behavioral: Negative.    All other systems reviewed and are negative.         Objective:   Vitals:  Vitals:   10/24/23 1322  BP: 134/70  Pulse: 86  Resp: 17  SpO2: 98%  Weight: 295 lb (133.8 kg)  Height: 5' 5 (1.651 m)    Body mass index is 49.09 kg/m.  Physical Exam Constitutional:      General: She is not in acute distress.    Appearance: Normal appearance. She is well-developed. She is obese. She is not ill-appearing, toxic-appearing or diaphoretic.  HENT:     Head: Normocephalic and atraumatic.     Right Ear: External ear normal. There is no  impacted cerumen.     Left Ear: External ear normal. There is no impacted cerumen.     Nose: Nose normal. No congestion or rhinorrhea.     Mouth/Throat:     Mouth: Mucous membranes are moist.     Pharynx: Oropharynx is clear. Uvula midline. No oropharyngeal exudate or posterior oropharyngeal erythema.  Eyes:     General: Lids are normal. No scleral icterus.       Right eye: No discharge.        Left eye: No discharge.     Conjunctiva/sclera: Conjunctivae normal.     Pupils: Pupils are equal, round, and reactive to light.  Neck:     Trachea: Phonation normal. No tracheal deviation.  Cardiovascular:     Rate and Rhythm: Normal rate and regular rhythm.     Pulses: Normal pulses.          Radial pulses are 2+ on the right side and 2+ on the left side.       Posterior tibial pulses are 2+ on the right side and 2+ on the left side.     Heart sounds: Normal heart sounds. No murmur heard.    No friction rub. No gallop.  Pulmonary:     Effort: Pulmonary effort is normal. No respiratory distress.     Breath sounds: Normal breath sounds. No stridor. No wheezing, rhonchi or rales.  Chest:     Chest wall: No tenderness.  Abdominal:     General: Bowel sounds are normal. There is no distension.     Palpations: Abdomen is soft.     Tenderness: There is no abdominal tenderness. There is no guarding or rebound.  Musculoskeletal:        General: No deformity. Normal range of motion.     Cervical back: Normal range of motion and neck supple.  Lymphadenopathy:     Cervical: No cervical adenopathy.  Skin:    General: Skin is warm and dry.     Capillary Refill: Capillary refill takes less than 2 seconds.     Coloration: Skin is not pale.     Findings: No rash.  Neurological:     Mental Status: She is alert and oriented to person, place, and time.     Motor: No abnormal muscle tone.     Gait: Gait normal.  Psychiatric:        Speech: Speech normal.        Behavior: Behavior normal.        Fall Risk:    10/24/2023    1:11 PM 10/22/2022    2:06 PM 08/15/2022   10:49 AM 04/03/2022    1:56 PM 03/20/2022   11:42 AM  Fall Risk   Falls in the past year? 1 0 0 0 0  Number falls in past yr: 0 0 0 0 0  Injury with Fall? 0 0 0 0 0  Risk for fall due to : No Fall Risks No Fall Risks No Fall Risks No Fall Risks   Follow up Falls prevention discussed Falls prevention discussed;Education provided;Falls evaluation completed Falls prevention discussed;Education provided;Falls evaluation completed Falls prevention discussed  Falls evaluation completed      Data saved with a previous flowsheet row definition    Functional Status Survey: Is the patient deaf or have difficulty hearing?: No Does the patient have difficulty seeing, even when wearing glasses/contacts?: No Does the patient have difficulty concentrating, remembering, or making decisions?: No Does the patient have difficulty walking or climbing stairs?: No Does the patient have difficulty dressing or bathing?: No Does the patient have difficulty doing errands alone such as visiting a doctor's office or shopping?: No   Assessment & Plan:    CPE completed today  USPSTF grade A and B recommendations reviewed with patient; age-appropriate recommendations, preventive care, screening tests, etc discussed and encouraged; healthy living encouraged; see AVS for patient education given to patient  Discussed importance of 150 minutes of physical activity weekly, AHA exercise recommendations given to pt in AVS/handout  Discussed importance of healthy diet:  eating lean meats and proteins, avoiding trans fats and saturated fats, avoid simple sugars and excessive carbs in diet, eat 6 servings of fruit/vegetables daily and drink plenty of water and avoid sweet beverages.    Recommended pt to do annual eye exam and routine dental exams/cleanings  Depression, alcohol, fall screening completed as documented above and per  flowsheets  Advance Care planning information and packet discussed and offered today, encouraged pt to discuss with family members/spouse/partner/friends and complete Advanced directive packet and bring copy to office   Reviewed Health Maintenance: Health Maintenance  Topic Date Due   COVID-19 Vaccine (6 - 2024-25 season) 11/08/2023 (Originally 12/08/2022)   INFLUENZA VACCINE  11/07/2023   MAMMOGRAM  07/07/2024   DTaP/Tdap/Td (2 - Td or Tdap) 04/27/2025   Cervical Cancer Screening (HPV/Pap Cotest)  05/31/2025   DEXA SCAN  01/02/2026   Colonoscopy  10/30/2026   Hepatitis C Screening  Completed   HIV Screening  Completed   Zoster Vaccines- Shingrix   Completed   Hepatitis B Vaccines  Aged Out   HPV VACCINES  Aged Out   Meningococcal B Vaccine  Aged Out    Immunizations: Immunization History  Administered Date(s) Administered   Moderna Covid-19 Fall Seasonal Vaccine 20yrs & older 02/21/2022   PFIZER(Purple Top)SARS-COV-2 Vaccination 07/09/2019, 08/03/2019,  04/11/2020   Pfizer Covid-19 Vaccine Bivalent Booster 46yrs & up 04/24/2021   Tdap 04/28/2015   Zoster Recombinant(Shingrix ) 02/21/2022, 11/19/2022      ICD-10-CM   1. Well adult exam  Z00.00 CBC with Differential/Platelet    Comprehensive metabolic panel with GFR    Hemoglobin A1c    Lipid panel    TSH     Assessment & Plan Obesity, Class III, BMI 40-49.9 (morbid obesity) She faces challenges with weight management affecting physical activity due to hip and side pain. Advised on calorie-reduced diet and increased exercise for weight management. Emphasized documentation of efforts for future medication approval. Discussed benefits of a diet rich in fruits, vegetables, lean meats, and muscle building for long-term health. - Document calorie intake and physical activity for insurance approval of weight management medications. Likely for age and activity level should limit caloric intake to 1600 or less daily to be in calorie  deficit.  Start adding more fruits/vegetables and lean proteins - Engage in low-intensity walking for at least 150 minutes per week. - Consider consulting with a nutritionist or registered dietitian for a personalized diet plan. - Explore indoor walking exercises on YouTube. - Consider orthopedic consultation for hip and back pain to facilitate increased physical activity.  Hyperlipidemia Lipid panel not rechecked since last year. - Order lipid panel.  Prediabetes A1c last checked in April. Suggested dietary changes may have improved A1c levels. - Consider rechecking A1c.  General Health Maintenance Up to date on several preventative screenings. Due for flu shot in September or October and tetanus booster in 2027. Discussed importance of maintaining muscle mass for long-term health and fracture risk reduction. - Administer flu shot when available in September or October. - Schedule bone density scan at age 71. - Administer tetanus booster in 2027.  Follow-up Planning to see a weight management specialist in September for tests and weight loss management. - Follow up with weight management specialist in September. - Order labs under physical exam for potential completion later.  Recording duration: 17 minutes       Michelene Cower, PA-C 10/24/23 1:42 PM  [facility]

## 2023-10-24 NOTE — Patient Instructions (Addendum)
 Health Maintenance  Topic Date Due   COVID-19 Vaccine (6 - 2024-25 season) 11/08/2023*   Flu Shot  11/07/2023   Mammogram  07/07/2024   DTaP/Tdap/Td vaccine (2 - Td or Tdap) 04/27/2025   Pap with HPV screening  05/31/2025   DEXA scan (bone density measurement)  01/02/2026   Colon Cancer Screening  10/30/2026   Hepatitis C Screening  Completed   HIV Screening  Completed   Zoster (Shingles) Vaccine  Completed   Hepatitis B Vaccine  Aged Out   HPV Vaccine  Aged Out   Meningitis B Vaccine  Aged Out  *Topic was postponed. The date shown is not the original due date.

## 2023-12-10 ENCOUNTER — Encounter (INDEPENDENT_AMBULATORY_CARE_PROVIDER_SITE_OTHER): Payer: Self-pay | Admitting: Family Medicine

## 2023-12-10 ENCOUNTER — Ambulatory Visit (INDEPENDENT_AMBULATORY_CARE_PROVIDER_SITE_OTHER): Admitting: Family Medicine

## 2023-12-10 VITALS — BP 130/84 | HR 71 | Temp 97.9°F | Ht 66.0 in | Wt 295.0 lb

## 2023-12-10 DIAGNOSIS — R7303 Prediabetes: Secondary | ICD-10-CM

## 2023-12-10 DIAGNOSIS — R5383 Other fatigue: Secondary | ICD-10-CM

## 2023-12-10 DIAGNOSIS — D649 Anemia, unspecified: Secondary | ICD-10-CM | POA: Diagnosis not present

## 2023-12-10 DIAGNOSIS — R0602 Shortness of breath: Secondary | ICD-10-CM

## 2023-12-10 DIAGNOSIS — Z6841 Body Mass Index (BMI) 40.0 and over, adult: Secondary | ICD-10-CM

## 2023-12-10 DIAGNOSIS — E782 Mixed hyperlipidemia: Secondary | ICD-10-CM

## 2023-12-10 DIAGNOSIS — I10 Essential (primary) hypertension: Secondary | ICD-10-CM | POA: Diagnosis not present

## 2023-12-10 DIAGNOSIS — E669 Obesity, unspecified: Secondary | ICD-10-CM

## 2023-12-10 DIAGNOSIS — Z1331 Encounter for screening for depression: Secondary | ICD-10-CM | POA: Diagnosis not present

## 2023-12-10 NOTE — Assessment & Plan Note (Addendum)
 Previously on medication- lisinopril  5 or 10mg  but BP controlled today.  Patient no longer on medication.  No chest pain, chest pressure or headache.  CMP today

## 2023-12-10 NOTE — Progress Notes (Unsigned)
 Chief Complaint:  Obesity   Subjective:  Amber Murray (MR# [id number]) is a 63 y.o. female who presents for evaluation and treatment of obesity and related comorbidities.   Amber Murray is currently in the action stage of change and ready to dedicate time achieving and maintaining a healthier weight. Amber Murray is interested in becoming our patient and working on intensive lifestyle modifications including (but not limited to) diet and exercise for weight loss.  Amber Murray has been struggling with her weight. She has been unsuccessful in either losing weight, maintaining weight loss, or reaching her healthy weight goal.  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 Living with husband Amber Murray and son Amber Murray (22).  She mentions her family is supportive of her, they eat meals together and they will be eating healthier with her.  She does anticipate sabotage from her husband as he is the cook in the family.  Desired weight is 200lbs; last time she was that weight in 2001/2000.  Steady weight gain since that time. Previously has worked on more mindful eating. Eats out 2-3 times a week. Food dislikes are greasy foods and green peppers.  Skips breakfast and sometimes lunch 2-3 times a week.  She mentions she is just not hungry until around 10am-12pm.  If she does eat breakfast she will likely skip lunch.  She will snack during that time.   Food Recall: Coffee in the am with 2 tbsp of cream.  After that she won't have anything except occasionally another cup of coffee.  She may do scrambled eggs with 2 tbsp of cheese and vegetable oil.  Lunch time will be a sandwich or leftovers. Sandwich will be [place] or chicken 4 slices and slice of cheese.  Not really hungry at lunch.  Feels satisfied. Will eat again with some chips- out of a big bag onto a paper towel.  She may also eat a banana.  Maybe will have chex mix. Dinner will be pork chops, mashed potatoes and green beans.  1 thick slice pork chop, 1.5  cup mashed potato and 1 cup vegetables.  May have gravy on pork chop.  Not eating much after dinner.  May have popcorn or chips.   Indirect Calorimeter completed today shows a RMR: 1829.  Her calculated basal metabolic rate is 7946 thus her basal metabolic rate is worse than expected.  Other Fatigue Amber Murray denies daytime somnolence and denies waking up still tired. Patient has no history of symptoms of OSA. Amber Murray generally gets 8-10 hours of sleep per night, and states that she has generally restful sleep. Snoring is present. Apneic episodes are not present. Epworth Sleepiness Score is 10.   Shortness of Breath Amber Murray notes increasing shortness of breath with exercising and seems to be worsening over time with weight gain. She notes getting out of breath sooner with activity than she used to. This has not gotten worse recently. Amber Murray denies shortness of breath at rest or orthopnea.  Depression Screen Amber Murray Food and Mood (modified PHQ-9) score was 13.     12/10/2023    9:02 AM  Depression screen PHQ 2/9  Decreased Interest 0  Down, Depressed, Hopeless 0  PHQ - 2 Score 0  Altered sleeping 0  Tired, decreased energy 1  Change in appetite 2  Feeling bad or failure about yourself  0  Trouble concentrating 1  Moving slowly or fidgety/restless 0  Suicidal thoughts 0  PHQ-9 Score 4  Difficult doing work/chores Not difficult at all  Objective:  Vitals Temp: 97.9 F (36.6 C) BP: 130/84 Pulse Rate: 71 SpO2: 99 %   Anthropometric Measurements Height: 5' 6 (1.676 m) Weight: 295 lb (133.8 kg) BMI (Calculated): 47.64 Starting Weight: 295 lb Peak Weight: 320 lb Waist Measurement : 51 inches   Body Composition  Body Fat %: 51.7 % Fat Mass (lbs): 152.6 lbs Muscle Mass (lbs): 135.4 lbs Total Body Water (lbs): 96.8 lbs Visceral Fat Rating : 19   Other Clinical Data RMR: 1829 Fasting: yes Labs: yes Today's Visit #: 1 Starting Date: 12/10/23 Comments: 1st    EKG:  Normal sinus rhythm, rate 69.  General: Cooperative, alert, well developed, in no acute distress. HEENT: Conjunctivae and lids unremarkable. Cardiovascular: Regular rhythm.  Lungs: Normal work of breathing. Neurologic: No focal deficits.   Lab Results  Component Value Date   CREATININE 0.99 12/10/2023   BUN 14 12/10/2023   NA 139 12/10/2023   K 4.4 12/10/2023   CL 104 12/10/2023   CO2 22 12/10/2023   Lab Results  Component Value Date   ALT 17 12/10/2023   AST 21 12/10/2023   ALKPHOS 109 12/10/2023   BILITOT 0.4 12/10/2023   Lab Results  Component Value Date   HGBA1C 5.8 (H) 12/10/2023   HGBA1C 5.8 (H) 07/09/2023   HGBA1C 5.9 (H) 10/22/2022   HGBA1C 5.5 09/13/2021   HGBA1C 5.5 05/31/2020   Lab Results  Component Value Date   INSULIN  WILL FOLLOW 12/10/2023   Lab Results  Component Value Date   TSH 1.460 12/10/2023   Lab Results  Component Value Date   CHOL 197 12/10/2023   HDL 56 12/10/2023   LDLCALC 123 (H) 12/10/2023   TRIG 102 12/10/2023   CHOLHDL 3.6 10/22/2022   Lab Results  Component Value Date   WBC 4.1 12/10/2023   HGB 12.3 12/10/2023   HCT 39.3 12/10/2023   MCV 92 12/10/2023   PLT 276 12/10/2023   Lab Results  Component Value Date   IRON 77 11/19/2022   TIBC 283 11/19/2022   FERRITIN 440 (H) 11/19/2022    Assessment and Plan:  Assessment & Plan Prediabetes Last A1c 5.8.  Diagnosed around 10 years ago.  Not on any medication for prediabetes.  Will repeat A1c and Insulin  today. Essential hypertension, benign Previously on medication- lisinopril  5 or 10mg  but BP controlled today.  Patient no longer on medication.  No chest pain, chest pressure or headache.  CMP today Mixed hyperlipidemia Elevated LDL on previously labs.  Not on medication.  ASCVD risk from last labs of  The 10-year ASCVD risk score (Arnett DK, et al., 2019) is: 6.8%   Values used to calculate the score:     Age: 50 years     Clincally relevant sex: Female     Is  Non-Hispanic African American: Yes     Diabetic: No     Tobacco smoker: No     Systolic Blood Pressure: 130 mmHg     Is BP treated: No     HDL Cholesterol: 56 mg/dL     Total Cholesterol: 197 mg/dL  Repeat FLP today. Normocytic anemia Previously low hemoglobin and hematocrit.  MCV at that time WNL. Will repeat CBC and Anemia Panel today.  Unclear what caused acute and limited drop in prior hemoglobin and hematocrit. Depression screening  SOBOE (shortness of breath on exertion)  Obesity with starting BMI of 47.6  Other fatigue  BMI 45.0-49.9, adult (HCC)    Other Fatigue  Amber Murray does feel  that her weight is causing her energy to be lower than it should be. Fatigue may be related to obesity, depression or many other causes. Labs will be ordered, and in the meanwhile, Amber Murray will focus on self care including making healthy food choices, increasing physical activity and focusing on stress reduction.  Shortness of Breath  Amber Murray does feel that she gets out of breath more easily that she used to when she exercises. Amber Murray's shortness of breath appears to be obesity related and exercise induced. She has agreed to work on weight loss and gradually increase exercise to treat her exercise induced shortness of breath. Will continue to monitor closely.   Problem List Items Addressed This Visit       Cardiovascular and Mediastinum   Essential hypertension, benign (Chronic)   Previously on medication- lisinopril  5 or 10mg  but BP controlled today.  Patient no longer on medication.  No chest pain, chest pressure or headache.  CMP today      Relevant Orders   Comprehensive metabolic panel with GFR (Completed)     Other   Prediabetes   Last A1c 5.8.  Diagnosed around 10 years ago.  Not on any medication for prediabetes.  Will repeat A1c and Insulin  today.      Relevant Orders   Hemoglobin A1c (Completed)   Insulin , random (Completed)   Hyperlipidemia   Elevated LDL on previously labs.   Not on medication.  ASCVD risk from last labs of  The 10-year ASCVD risk score (Arnett DK, et al., 2019) is: 6.4%   Values used to calculate the score:     Age: 80 years     Clincally relevant sex: Female     Is Non-Hispanic African American: Yes     Diabetic: No     Tobacco smoker: No     Systolic Blood Pressure: 130 mmHg     Is BP treated: No     HDL Cholesterol: 48 mg/dL     Total Cholesterol: 173 mg/dL  Repeat FLP today.      Relevant Orders   Lipid Panel With LDL/HDL Ratio (Completed)   Normocytic anemia   Previously low hemoglobin and hematocrit.  MCV at that time WNL. Will repeat CBC and Anemia Panel today.      Relevant Orders   CBC with Differential/Platelet (Completed)   Other Visit Diagnoses       Other fatigue    -  Primary   Relevant Orders   EKG 12-Lead   Vitamin B12 (Completed)   T4, free (Completed)   T3 (Completed)   Folate (Completed)   VITAMIN D  25 Hydroxy (Vit-D Deficiency, Fractures) (Completed)   TSH (Completed)     Depression screening         SOBOE (shortness of breath on exertion)         Obesity with starting BMI of 47.6         BMI 45.0-49.9, adult (HCC)           Amber Murray is currently in the action stage of change and her goal is to continue with weight loss efforts. I recommend Amber Murray begin the structured treatment plan as follows:  She has agreed to Category 3 Plan  Exercise goals: For substantial health benefits, adults should do at least 150 minutes (2 hours and 30 minutes) a week of moderate-intensity, or 75 minutes (1 hour and 15 minutes) a week of vigorous-intensity aerobic physical activity, or an equivalent combination of moderate- and vigorous-intensity aerobic activity. Aerobic  activity should be performed in episodes of at least 10 minutes, and preferably, it should be spread throughout the week.  Behavioral modification strategies:increasing lean protein intake, decreasing simple carbohydrates, meal planning and cooking  strategies, keeping healthy foods in the home, better snacking choices, and planning for success  She was informed of the importance of frequent follow-up visits to maximize her success with intensive lifestyle modifications for her multiple health conditions. She was informed we would discuss her lab results at her next visit unless there is a critical issue that needs to be addressed sooner. Amber Murray agreed to keep her next visit at the agreed upon time to discuss these results.  Labs ordered with plans to discuss at the next visit.   Attestation Statements:  Reviewed by clinician on day of visit: allergies, medications, problem list, medical history, surgical history, family history, social history, and previous encounter notes.  This is the patient's first visit at [facility]. The patient's NEW PATIENT PACKET was reviewed at length. Included in the packet: current and past health history, medications, allergies, ROS, gynecologic history (women only), surgical history, family history, social history, weight history, weight loss surgery history (for those that have had weight loss surgery), nutritional evaluation, mood and food questionnaire, PHQ9, Epworth questionnaire, sleep habits questionnaire, patient life and health improvement goals questionnaire. These will all be scanned into the patient's chart under media.   During the visit, I independently reviewed the patient's EKG, bioimpedance scale results, and indirect calorimeter results. I used this information to tailor a meal plan for the patient that will help her to lose weight and will improve her obesity-related conditions going forward. I performed a medically necessary appropriate examination and/or evaluation. I discussed the assessment and treatment plan with the patient. The patient was provided an opportunity to ask questions and all were answered. The patient agreed with the plan and demonstrated an understanding of the  instructions. Labs were ordered at this visit and will be reviewed at the next visit unless more critical results need to be addressed immediately. Clinical information was updated and documented in the EMR.    Adelita Cho, MD

## 2023-12-10 NOTE — Assessment & Plan Note (Addendum)
 Previously low hemoglobin and hematocrit.  MCV at that time WNL. Will repeat CBC and Anemia Panel today.

## 2023-12-10 NOTE — Assessment & Plan Note (Addendum)
 Elevated LDL on previously labs.  Not on medication.  ASCVD risk from last labs of  The 10-year ASCVD risk score (Arnett DK, et al., 2019) is: 6.8%   Values used to calculate the score:     Age: 63 years     Clincally relevant sex: Female     Is Non-Hispanic African American: Yes     Diabetic: No     Tobacco smoker: No     Systolic Blood Pressure: 130 mmHg     Is BP treated: No     HDL Cholesterol: 56 mg/dL     Total Cholesterol: 197 mg/dL  Repeat FLP today.

## 2023-12-10 NOTE — Assessment & Plan Note (Addendum)
 Last A1c 5.8.  Diagnosed around 10 years ago.  Not on any medication for prediabetes.  Will repeat A1c and Insulin  today.

## 2023-12-11 LAB — CBC WITH DIFFERENTIAL/PLATELET
Basophils Absolute: 0.1 x10E3/uL (ref 0.0–0.2)
Basos: 1 %
EOS (ABSOLUTE): 0.2 x10E3/uL (ref 0.0–0.4)
Eos: 5 %
Hematocrit: 39.3 % (ref 34.0–46.6)
Hemoglobin: 12.3 g/dL (ref 11.1–15.9)
Immature Grans (Abs): 0 x10E3/uL (ref 0.0–0.1)
Immature Granulocytes: 0 %
Lymphocytes Absolute: 1.7 x10E3/uL (ref 0.7–3.1)
Lymphs: 42 %
MCH: 28.8 pg (ref 26.6–33.0)
MCHC: 31.3 g/dL — ABNORMAL LOW (ref 31.5–35.7)
MCV: 92 fL (ref 79–97)
Monocytes Absolute: 0.4 x10E3/uL (ref 0.1–0.9)
Monocytes: 9 %
Neutrophils Absolute: 1.8 x10E3/uL (ref 1.4–7.0)
Neutrophils: 43 %
Platelets: 276 x10E3/uL (ref 150–450)
RBC: 4.27 x10E6/uL (ref 3.77–5.28)
RDW: 12.9 % (ref 11.7–15.4)
WBC: 4.1 x10E3/uL (ref 3.4–10.8)

## 2023-12-11 LAB — COMPREHENSIVE METABOLIC PANEL WITH GFR
ALT: 17 IU/L (ref 0–32)
AST: 21 IU/L (ref 0–40)
Albumin: 4.4 g/dL (ref 3.9–4.9)
Alkaline Phosphatase: 109 IU/L (ref 44–121)
BUN/Creatinine Ratio: 14 (ref 12–28)
BUN: 14 mg/dL (ref 8–27)
Bilirubin Total: 0.4 mg/dL (ref 0.0–1.2)
CO2: 22 mmol/L (ref 20–29)
Calcium: 10.1 mg/dL (ref 8.7–10.3)
Chloride: 104 mmol/L (ref 96–106)
Creatinine, Ser: 0.99 mg/dL (ref 0.57–1.00)
Globulin, Total: 2.7 g/dL (ref 1.5–4.5)
Glucose: 101 mg/dL — ABNORMAL HIGH (ref 70–99)
Potassium: 4.4 mmol/L (ref 3.5–5.2)
Sodium: 139 mmol/L (ref 134–144)
Total Protein: 7.1 g/dL (ref 6.0–8.5)
eGFR: 64 mL/min/1.73 (ref >59)

## 2023-12-11 LAB — LIPID PANEL WITH LDL/HDL RATIO
Cholesterol, Total: 197 mg/dL (ref 100–199)
HDL: 56 mg/dL (ref >39)
LDL Chol Calc (NIH): 123 mg/dL — ABNORMAL HIGH (ref 0–99)
LDL/HDL Ratio: 2.2 ratio (ref 0.0–3.2)
Triglycerides: 102 mg/dL (ref 0–149)
VLDL Cholesterol Cal: 18 mg/dL (ref 5–40)

## 2023-12-11 LAB — T4, FREE: Free T4: 1.27 ng/dL (ref 0.82–1.77)

## 2023-12-11 LAB — INSULIN, RANDOM: INSULIN: 16.6 u[IU]/mL (ref 2.6–24.9)

## 2023-12-11 LAB — T3: T3, Total: 146 ng/dL (ref 71–180)

## 2023-12-11 LAB — TSH: TSH: 1.46 u[IU]/mL (ref 0.450–4.500)

## 2023-12-11 LAB — FOLATE: Folate: 11.9 ng/mL (ref >3.0)

## 2023-12-11 LAB — HEMOGLOBIN A1C
Est. average glucose Bld gHb Est-mCnc: 120 mg/dL
Hgb A1c MFr Bld: 5.8 % — ABNORMAL HIGH (ref 4.8–5.6)

## 2023-12-11 LAB — VITAMIN B12: Vitamin B-12: >2000 pg/mL — ABNORMAL HIGH (ref 232–1245)

## 2023-12-11 LAB — VITAMIN D 25 HYDROXY (VIT D DEFICIENCY, FRACTURES): Vit D, 25-Hydroxy: 39.2 ng/mL (ref 30.0–100.0)

## 2023-12-24 ENCOUNTER — Encounter (INDEPENDENT_AMBULATORY_CARE_PROVIDER_SITE_OTHER): Payer: Self-pay | Admitting: Family Medicine

## 2023-12-24 ENCOUNTER — Ambulatory Visit (INDEPENDENT_AMBULATORY_CARE_PROVIDER_SITE_OTHER): Admitting: Family Medicine

## 2023-12-24 VITALS — BP 116/83 | HR 73 | Temp 97.8°F | Ht 66.0 in | Wt 291.0 lb

## 2023-12-24 DIAGNOSIS — E782 Mixed hyperlipidemia: Secondary | ICD-10-CM

## 2023-12-24 DIAGNOSIS — I10 Essential (primary) hypertension: Secondary | ICD-10-CM | POA: Diagnosis not present

## 2023-12-24 DIAGNOSIS — R7303 Prediabetes: Secondary | ICD-10-CM | POA: Diagnosis not present

## 2023-12-24 DIAGNOSIS — Z6841 Body Mass Index (BMI) 40.0 and over, adult: Secondary | ICD-10-CM

## 2023-12-24 DIAGNOSIS — E559 Vitamin D deficiency, unspecified: Secondary | ICD-10-CM

## 2023-12-24 DIAGNOSIS — D649 Anemia, unspecified: Secondary | ICD-10-CM | POA: Diagnosis not present

## 2023-12-24 DIAGNOSIS — E669 Obesity, unspecified: Secondary | ICD-10-CM

## 2023-12-24 NOTE — Assessment & Plan Note (Signed)
 The 10-year ASCVD risk score (Arnett DK, et al., 2019) is: 5.1%   Values used to calculate the score:     Age: 64 years     Clincally relevant sex: Female     Is Non-Hispanic African American: Yes     Diabetic: No     Tobacco smoker: No     Systolic Blood Pressure: 116 mmHg     Is BP treated: No     HDL Cholesterol: 56 mg/dL     Total Cholesterol: 197 mg/dL

## 2023-12-24 NOTE — Progress Notes (Signed)
 SUBJECTIVE:  Chief Complaint: Obesity  Interim History: Patient felt the first few weeks were not that bad when it came to the meal plan.  Dinner tended to be the most difficult.  She voices that she occasionally didn't feel like cooking so they would eat out at like [employer] or [employer].  Opted for egg breakfast and lunch was the sandwich, greek yogurt and an apple.  Felt full.  At dinner patient was not always able to get in the full 8-10oz.  For snack calories she chose banana, yogurt with fruit and oreo thins and veggie straws and babybell cheese. No anticipated events or travel over the next few weeks.  She has a family reunion in October.   Amber Murray is here to discuss her progress with her obesity treatment plan. She is on the Category 3 Plan and states she is following her eating plan approximately 80 % of the time. She states she is exercising 10 minutes 5 times per week.   OBJECTIVE: Visit Diagnoses: Problem List Items Addressed This Visit       Cardiovascular and Mediastinum   Essential hypertension, benign (Chronic)     Other   Prediabetes - Primary   Hyperlipidemia   Normocytic anemia   Other Visit Diagnoses       Obesity with starting BMI of 47.6         BMI 45.0-49.9, adult (HCC)           Vitals Temp: 97.8 F (36.6 C) BP: 116/83 Pulse Rate: 73 SpO2: 98 %   Anthropometric Measurements Height: 5' 6 (1.676 m) Weight: 291 lb (132 kg) BMI (Calculated): 46.99 Weight at Last Visit: 295 lb Weight Lost Since Last Visit: 4 Weight Gained Since Last Visit: 0 Starting Weight: 295 lb Total Weight Loss (lbs): 4 lb (1.814 kg)   Body Composition  Body Fat %: 50.6 % Fat Mass (lbs): 147.4 lbs Muscle Mass (lbs): 136.6 lbs Total Body Water (lbs): 94.4 lbs Visceral Fat Rating : 19   Other Clinical Data Today's Visit #: 2 Starting Date: 12/10/23 Comments: Cat 3     ASSESSMENT AND PLAN: Assessment & Plan Prediabetes Pathophysiology of progression through  insulin  resistance to prediabetes and diabetes was discussed at length today.  Patient to continue to monitor and be in control of total intake of snack calories which may be simple carbohydrates but should be consumed only after the patient has taken in all the nutrition for the day.  Macronutrient identification, classification and daily intake ratios were discussed.  Plan to repeat labs in 3 months to monitor both hemoglobin A1c and insulin  levels.  No medications at this time as patient is not having significant hunger or cravings that would make following meal plan more difficult.    Essential hypertension, benign Blood pressure well controlled today.  No chest pain, chest pressure or headache.  No intervention at this time. Mixed hyperlipidemia The 10-year ASCVD risk score (Arnett DK, et al., 2019) is: 5.1%   Values used to calculate the score:     Age: 73 years     Clincally relevant sex: Female     Is Non-Hispanic African American: Yes     Diabetic: No     Tobacco smoker: No     Systolic Blood Pressure: 116 mmHg     Is BP treated: No     HDL Cholesterol: 56 mg/dL     Total Cholesterol: 197 mg/dL  Normocytic anemia CBC within normal limits on labs done  at first appointment.  No intervention necessary.  Vitamin D  deficiency Discussed importance of vitamin d  supplementation.  Vitamin d  supplementation has been shown to decrease fatigue, decrease risk of progression to insulin  resistance and then prediabetes, decreases risk of falling in older age and can even assist in decreasing depressive symptoms in PTSD.   Patient to increase her OTC to [facility] international units daily.   Obesity with starting BMI of 47.6  BMI 45.0-49.9, adult (HCC)    Diet: Nattie is currently in the action stage of change. As such, her goal is to continue with weight loss efforts and has agreed to the Category 3 Plan.   Exercise:  For substantial health benefits, adults should do at least 150 minutes (2 hours  and 30 minutes) a week of moderate-intensity, or 75 minutes (1 hour and 15 minutes) a week of vigorous-intensity aerobic physical activity, or an equivalent combination of moderate- and vigorous-intensity aerobic activity. Aerobic activity should be performed in episodes of at least 10 minutes, and preferably, it should be spread throughout the week.  Behavior Modification:  We discussed the following Behavioral Modification Strategies today: increasing lean protein intake, decreasing simple carbohydrates, increasing vegetables, meal planning and cooking strategies, keeping healthy foods in the home, and avoiding temptations.   Return in about 2 weeks (around 01/07/2024).   She was informed of the importance of frequent follow up visits to maximize her success with intensive lifestyle modifications for her multiple health conditions.  Attestation Statements:   Reviewed by clinician on day of visit: allergies, medications, problem list, medical history, surgical history, family history, social history, and previous encounter notes.     Adelita Cho, MD

## 2023-12-24 NOTE — Assessment & Plan Note (Signed)
 CBC within normal limits on labs done at first appointment.  No intervention necessary.

## 2023-12-24 NOTE — Assessment & Plan Note (Signed)

## 2023-12-24 NOTE — Assessment & Plan Note (Signed)
 Blood pressure well controlled today.  No chest pain, chest pressure or headache.  No intervention at this time.

## 2024-01-14 ENCOUNTER — Ambulatory Visit (INDEPENDENT_AMBULATORY_CARE_PROVIDER_SITE_OTHER): Admitting: Physician Assistant

## 2024-01-20 ENCOUNTER — Ambulatory Visit (INDEPENDENT_AMBULATORY_CARE_PROVIDER_SITE_OTHER): Admitting: Family Medicine

## 2024-01-22 ENCOUNTER — Ambulatory Visit (INDEPENDENT_AMBULATORY_CARE_PROVIDER_SITE_OTHER): Admitting: Family Medicine

## 2024-01-22 VITALS — BP 106/74 | HR 80 | Temp 98.0°F | Ht 66.0 in | Wt 285.0 lb

## 2024-01-22 DIAGNOSIS — Z6841 Body Mass Index (BMI) 40.0 and over, adult: Secondary | ICD-10-CM

## 2024-01-22 DIAGNOSIS — R7303 Prediabetes: Secondary | ICD-10-CM

## 2024-01-22 DIAGNOSIS — E669 Obesity, unspecified: Secondary | ICD-10-CM

## 2024-01-22 NOTE — Progress Notes (Signed)
   SUBJECTIVE:  Chief Complaint: Obesity  Interim History: Patient just went on her family vacation and she was mindful while away.  The beach was rainy and windy and she was able to go to an art show that weekend.  She was eating out for breakfast and an all you can eat buffet one night.  She did eat quite a bit of cheesy stuff at the buffet. Patient went grocery shopping yesterday and restocked on nutritious food.  She is getting in quite a bit of greek yogurt daily.  She is occasionally desiring to eat but isn't necessarily hungry.  Has an upcoming [place] night at church.  Has been craving french fries.   Amber Murray is here to discuss her progress with her obesity treatment plan. She is on the Category 3 Plan and states she is following her eating plan approximately 60 % of the time. She states she is exercising 15 minutes 3-4 times per week.   OBJECTIVE: Visit Diagnoses: Problem List Items Addressed This Visit       Other   Prediabetes - Primary   Other Visit Diagnoses       Obesity with starting BMI of 47.6         BMI 45.0-49.9, adult (HCC)           Vitals Temp: 98 F (36.7 C) BP: 106/74 Pulse Rate: 80 SpO2: 99 %   Anthropometric Measurements Height: 5' 6 (1.676 m) Weight: 285 lb (129.3 kg) BMI (Calculated): 46.02 Weight at Last Visit: 291 lb Weight Lost Since Last Visit: 6 Weight Gained Since Last Visit: 0 Starting Weight: 295 Total Weight Loss (lbs): 10 lb (4.536 kg) Waist Measurement : 49 inches   Body Composition  Body Fat %: 50.7 % Fat Mass (lbs): 145 lbs Muscle Mass (lbs): 133.8 lbs Total Body Water (lbs): 94 lbs Visceral Fat Rating : 19   Other Clinical Data Today's Visit #: 3 Starting Date: 12/10/23 Comments: Cat 3     ASSESSMENT AND PLAN: Assessment & Plan Prediabetes Still experiencing cravings in the form of starch or [place] fries.  Patient reports that she will occasionally eat when not hungry.  We discussed elevated insulin  levels  as an a drive for indulgent eating as well as anticipation of improvement in some craving strength with resolution of prediabetes.  Will follow-up at next appointment as to whether or not getting more consistently and plan has helped patient control cravings. Obesity with starting BMI of 47.6  BMI 45.0-49.9, adult (HCC)    Diet: [place] is currently in the action stage of change. As such, her goal is to continue with weight loss efforts and has agreed to the Category 3 Plan.   Exercise:  All adults should avoid inactivity. Some activity is better than none, and adults who participate in any amount of physical activity, gain some health benefits.  Behavior Modification:  We discussed the following Behavioral Modification Strategies today: increasing lean protein intake, decreasing simple carbohydrates, increasing vegetables, meal planning and cooking strategies, and planning for success.   Return in about 3 weeks (around 02/12/2024).   She was informed of the importance of frequent follow up visits to maximize her success with intensive lifestyle modifications for her multiple health conditions.  Attestation Statements:   Reviewed by clinician on day of visit: allergies, medications, problem list, medical history, surgical history, family history, social history, and previous encounter notes.   Adelita Cho, MD

## 2024-01-23 NOTE — Assessment & Plan Note (Signed)
 Still experiencing cravings in the form of starch or [place] fries.  Patient reports that she will occasionally eat when not hungry.  We discussed elevated insulin  levels as an a drive for indulgent eating as well as anticipation of improvement in some craving strength with resolution of prediabetes.  Will follow-up at next appointment as to whether or not getting more consistently and plan has helped patient control cravings.

## 2024-02-05 ENCOUNTER — Ambulatory Visit (INDEPENDENT_AMBULATORY_CARE_PROVIDER_SITE_OTHER): Admitting: Nurse Practitioner

## 2024-02-05 ENCOUNTER — Encounter (INDEPENDENT_AMBULATORY_CARE_PROVIDER_SITE_OTHER): Payer: Self-pay | Admitting: Nurse Practitioner

## 2024-02-05 ENCOUNTER — Ambulatory Visit (INDEPENDENT_AMBULATORY_CARE_PROVIDER_SITE_OTHER): Admitting: Family Medicine

## 2024-02-05 VITALS — BP 100/69 | HR 82 | Temp 98.1°F | Ht 66.0 in | Wt 281.0 lb

## 2024-02-05 DIAGNOSIS — Z6841 Body Mass Index (BMI) 40.0 and over, adult: Secondary | ICD-10-CM | POA: Diagnosis not present

## 2024-02-05 DIAGNOSIS — I10 Essential (primary) hypertension: Secondary | ICD-10-CM

## 2024-02-05 DIAGNOSIS — E88819 Insulin resistance, unspecified: Secondary | ICD-10-CM | POA: Diagnosis not present

## 2024-02-05 DIAGNOSIS — E66813 Obesity, class 3: Secondary | ICD-10-CM

## 2024-02-05 DIAGNOSIS — R7303 Prediabetes: Secondary | ICD-10-CM

## 2024-02-05 MED ORDER — METFORMIN HCL ER 500 MG PO TB24: 500.0000 mg | ORAL_TABLET | Freq: Every day | ORAL | 0 refills | Status: DC

## 2024-02-05 NOTE — Progress Notes (Signed)
 Office: [phone]  /  Fax: [phone]  WEIGHT SUMMARY AND BIOMETRICS  Weight Lost Since Last Visit: 4 lb  Weight Gained Since Last Visit: 0   Vitals Temp: 98.1 F (36.7 C) BP: 100/69 Pulse Rate: 82 SpO2: 96 %   Anthropometric Measurements Height: 5' 6 (1.676 m) Weight: 281 lb (127.5 kg) BMI (Calculated): 45.38 Weight at Last Visit: 285 lb Weight Lost Since Last Visit: 4 lb Weight Gained Since Last Visit: 0 Starting Weight: 295 lb Total Weight Loss (lbs): 14 lb (6.35 kg)   Body Composition  Body Fat %: 49.3 % Fat Mass (lbs): 138.6 lbs Muscle Mass (lbs): 135.4 lbs Total Body Water (lbs): 91.4 lbs Visceral Fat Rating : 18   Other Clinical Data Fasting: no Labs: no Today's Visit #: 4 Starting Date: 12/10/23    Total Weight Loss:14 pounds Percent of body weight lost: 4.7%  Bio Impedance Data reviewed with patient:Muscle is up 1.6 pounds, adipose is down 6.4 pounds, PBF decreased from 50.7% to 49.3%. Visceral fat rating decreased from 198 to 18.   HPI  Chief Complaint: OBESITY  Amber Murray is here to discuss her progress with her obesity treatment plan. She is on the the Category 3 Plan and states she is following her eating plan approximately 60 % of the time. She states she is exercising 15 minutes 3 days per week.   Interval History:  Since last office visit she has not been getting enough protein especially at dinner.  She is feeling hungry after lunch.  Her launch is normally a sandwich with apple and yogurt. She starts to get hungry  1-2 hours after lunch. Her oven broke and she had to eat out for 5 days. She has been getting about 64 ounce of water daily but having some tooth sensitivity that is limiting cold fluids.She is doing more mindless snacking and craves crunchy/salty. She is not skipping meals. She is still having vodka and orange juice 2 /week, doesn't have anymore.  She is doing aerobics 15 minutes 3-4 days a week.   Amber Murray does have a  history of hypertension which is currently controlled without medication. Denies headaches and chest pain BP Readings from Last 3 Encounters:  02/05/24 100/69  01/22/24 106/74  12/24/23 116/83   HOMA-IR score is 4.14 with fasting insulin  of 16.6 indicating insulin  resistance.  She also has A1c of 5.8 indicating prediabetes.  She is currently on no medication.  Attempting to use nutrition, and behavior modification to improve. She is having mindless snacking.   PHYSICAL EXAM:  Blood pressure 100/69, pulse 82, temperature 98.1 F (36.7 C), height 5' 6 (1.676 m), weight 281 lb (127.5 kg), SpO2 96%. Body mass index is 45.35 kg/m.  General: Well Developed, well nourished, and in no acute distress.  HEENT: Normocephalic, atraumatic; EOMI, sclerae are anicteric. Skin: Warm and dry, good turgor Chest:  Normal excursion, shape, no gross ABN Respiratory: No conversational dyspnea; speaking in full sentences NeuroM-Sk:  Normal gross ROM * 4 extremities  Psych: A and O X 3, insight adequate, mood- full    DIAGNOSTIC DATA REVIEWED:  BMET    Component Value Date/Time   NA 139 12/10/2023 1010   K 4.4 12/10/2023 1010   CL 104 12/10/2023 1010   CO2 22 12/10/2023 1010   GLUCOSE 101 (H) 12/10/2023 1010   GLUCOSE 89 07/09/2023 1145   BUN 14 12/10/2023 1010   CREATININE 0.99 12/10/2023 1010   CREATININE 0.87 07/09/2023 1145   CALCIUM 10.1 12/10/2023 1010  GFRNONAA 97 05/31/2020 1129   GFRAA 113 05/31/2020 1129   Lab Results  Component Value Date   HGBA1C 5.8 (H) 12/10/2023   HGBA1C 5.7 (H) 05/04/2015   Lab Results  Component Value Date   INSULIN  16.6 12/10/2023   Lab Results  Component Value Date   TSH 1.460 12/10/2023   CBC    Component Value Date/Time   WBC 4.1 12/10/2023 1010   WBC 4.0 12/23/2022 1109   RBC 4.27 12/10/2023 1010   RBC 4.11 12/23/2022 1109   HGB 12.3 12/10/2023 1010   HCT 39.3 12/10/2023 1010   PLT 276 12/10/2023 1010   MCV 92 12/10/2023 1010   MCH  28.8 12/10/2023 1010   MCH 29.9 12/23/2022 1109   MCHC 31.3 (L) 12/10/2023 1010   MCHC 32.8 12/23/2022 1109   RDW 12.9 12/10/2023 1010   Iron Studies    Component Value Date/Time   IRON 77 11/19/2022 0902   TIBC 283 11/19/2022 0902   FERRITIN 440 (H) 11/19/2022 0902   IRONPCTSAT 27 11/19/2022 0902   Lipid Panel     Component Value Date/Time   CHOL 197 12/10/2023 1010   TRIG 102 12/10/2023 1010   HDL 56 12/10/2023 1010   CHOLHDL 3.6 10/22/2022 1448   VLDL 31 (H) 12/18/2015 1643   LDLCALC 123 (H) 12/10/2023 1010   LDLCALC 102 (H) 10/22/2022 1448   Hepatic Function Panel     Component Value Date/Time   PROT 7.1 12/10/2023 1010   ALBUMIN 4.4 12/10/2023 1010   AST 21 12/10/2023 1010   ALT 17 12/10/2023 1010   ALKPHOS 109 12/10/2023 1010   BILITOT 0.4 12/10/2023 1010      Component Value Date/Time   TSH 1.460 12/10/2023 1010   Nutritional Lab Results  Component Value Date   VD25OH 39.2 12/10/2023   VD25OH 59 10/22/2022     ASSESSMENT AND PLAN  Class 3 severe obesity with serious comorbidity and body mass index (BMI) of 45.0 to 49.9 in adult, unspecified obesity type (HCC) TREATMENT PLAN FOR OBESITY:  Recommended Dietary Goals  Amber Murray is currently in the action stage of change. As such, her goal is to continue weight management plan. She has agreed to the Category 3 Plan.  Behavioral Intervention  We discussed the following Behavioral Modification Strategies today: increasing water intake , better snacking choices, continue to work on maintaining a reduced calorie state, getting the recommended amount of protein, incorporating whole foods, making healthy choices, staying well hydrated and practicing mindfulness when eating., and increase protein intake, fibrous foods (25 grams per day for women, 30 grams for men) and water to improve satiety and decrease hunger signals. .   Recommended Physical Activity Goals  Amber Murray has been advised to work up to 150 minutes of  moderate intensity aerobic activity a week and strengthening exercises 2-3 times per week for cardiovascular health, weight loss maintenance and preservation of muscle mass.   She has agreed to Think about enjoyable ways to increase daily physical activity and overcoming barriers to exercise, Increase physical activity in their day and reduce sedentary time (increase NEAT)., and Combine aerobic and strengthening exercises for efficiency and improved cardiometabolic health.   Pharmacotherapy We discussed various medication options to help Janaria with her weight loss efforts and we both agreed to start Metformin XR 500 mg 1 tab PO every day with food for prediabetes/ insulin  resistance.  ASSOCIATED CONDITIONS ADDRESSED TODAY  Action/Plan  Essential hypertension, benign Currently well controlled without medication Continue Category 3  meal plan  and DASH diet Monitor BP and if consistently >140/90 notify PCP If develops headaches, chest pain, shortness of breath or dizziness go to ER  Prediabetes Insulin  resistance Continue Category 3  meal plan, limit simple carbohydrates Start Metformin XR 500 mg 1 tab daily with food- counseled on side effects, push water Continue exercise with current goal of 150 minutes of moderate to high intensity exercise/week.  -     metFORMIN HCl ER; Take 1 tablet (500 mg total) by mouth daily with breakfast.  Dispense: 30 tablet; Refill: 0        Return in about 4 weeks (around 03/04/2024).SABRA She was informed of the importance of frequent follow up visits to maximize her success with intensive lifestyle modifications for her multiple health conditions.   ATTESTASTION STATEMENTS:  Reviewed by clinician on day of visit: allergies, medications, problem list, medical history, surgical history, family history, social history, and previous encounter notes.     Charlette Hennings ANP-C

## 2024-03-02 ENCOUNTER — Encounter (INDEPENDENT_AMBULATORY_CARE_PROVIDER_SITE_OTHER): Payer: Self-pay | Admitting: Family Medicine

## 2024-03-02 ENCOUNTER — Ambulatory Visit (INDEPENDENT_AMBULATORY_CARE_PROVIDER_SITE_OTHER): Admitting: Family Medicine

## 2024-03-02 VITALS — BP 109/74 | HR 73 | Temp 98.0°F | Ht 66.0 in | Wt 279.0 lb

## 2024-03-02 DIAGNOSIS — E88819 Insulin resistance, unspecified: Secondary | ICD-10-CM | POA: Diagnosis not present

## 2024-03-02 DIAGNOSIS — Z6841 Body Mass Index (BMI) 40.0 and over, adult: Secondary | ICD-10-CM

## 2024-03-02 DIAGNOSIS — E669 Obesity, unspecified: Secondary | ICD-10-CM

## 2024-03-02 MED ORDER — METFORMIN HCL ER 500 MG PO TB24
1000.0000 mg | ORAL_TABLET | Freq: Every day | ORAL | 0 refills | Status: AC
Start: 2024-03-02 — End: ?

## 2024-03-02 NOTE — Progress Notes (Signed)
   SUBJECTIVE:  Chief Complaint: Obesity  Interim History: Patient started metformin  since last appointment.  She is wondering about qsymia . She was anticipating losing about 3lbs a week.  She is eating on plan 50% of the time.  She was skipping her lunch when she was working out in the yard over the last few weeks.  When she would come into eat she would opt for eating out to avoid having to cook.  She is wondering about restaurants that she often eats at and how to eat healthier.  She is hosting Thanksgiving party at her house.  For the month of December she is going to [place] and [place]  for the holidays.   Amber Murray is here to discuss her progress with her obesity treatment plan. She is on the Category 3 Plan and states she is following her eating plan approximately 50 % of the time. She states she is working 2-3 hours 2-3 times per week.   OBJECTIVE: Visit Diagnoses: Problem List Items Addressed This Visit   None   Vitals Temp: 98 F (36.7 C) BP: 109/74 Pulse Rate: 73 SpO2: 99 %   Anthropometric Measurements Height: 5' 6 (1.676 m) Weight: 279 lb (126.6 kg) BMI (Calculated): 45.05 Weight at Last Visit: 281 lb Weight Lost Since Last Visit: 2 Weight Gained Since Last Visit: 0 Starting Weight: 295 lb Total Weight Loss (lbs): 16 lb (7.258 kg)   Body Composition  Body Fat %: 50.9 % Fat Mass (lbs): 142.4 lbs Muscle Mass (lbs): 130.2 lbs Total Body Water (lbs): 96.2 lbs Visceral Fat Rating : 18   Other Clinical Data Today's Visit #: 5 Starting Date: 12/10/23 Comments: Cat 3     ASSESSMENT AND PLAN: Assessment & Plan Insulin  resistance Patient reports that she has been able to tolerate metformin  daily.  She denies any significant GI side effects.  Will increase metformin  to 1000 mg daily which patient was instructed she can take together in the morning or split into 2 doses throughout the day.  Higher dosage sent into pharmacy as a new prescription. BMI 45.0-49.9, adult  (HCC)  Obesity with starting BMI of 47.6    Diet: Amber Murray is currently in the action stage of change. As such, her goal is to continue with weight loss efforts and has agreed to the Category 3 Plan.   Exercise:  For substantial health benefits, adults should do at least 150 minutes (2 hours and 30 minutes) a week of moderate-intensity, or 75 minutes (1 hour and 15 minutes) a week of vigorous-intensity aerobic physical activity, or an equivalent combination of moderate- and vigorous-intensity aerobic activity. Aerobic activity should be performed in episodes of at least 10 minutes, and preferably, it should be spread throughout the week.  Behavior Modification:  We discussed the following Behavioral Modification Strategies today: increasing lean protein intake, decreasing simple carbohydrates, increasing vegetables, meal planning and cooking strategies, holiday eating strategies, and planning for success.   Follow-up in 4 weeks   She was informed of the importance of frequent follow up visits to maximize her success with intensive lifestyle modifications for her multiple health conditions.  Attestation Statements:   Reviewed by clinician on day of visit: allergies, medications, problem list, medical history, surgical history, family history, social history, and previous encounter notes.     Amber Cho, MD

## 2024-04-15 ENCOUNTER — Ambulatory Visit (INDEPENDENT_AMBULATORY_CARE_PROVIDER_SITE_OTHER): Admitting: Family Medicine

## 2024-05-06 ENCOUNTER — Ambulatory Visit (INDEPENDENT_AMBULATORY_CARE_PROVIDER_SITE_OTHER): Admitting: Family Medicine

## 2024-05-19 ENCOUNTER — Ambulatory Visit (INDEPENDENT_AMBULATORY_CARE_PROVIDER_SITE_OTHER): Admitting: Family Medicine
